# Patient Record
Sex: Male | Born: 1940 | Race: White | Hispanic: No | Smoking: Former smoker
Health system: Southern US, Community
[De-identification: ages and names within clinical notes are randomized; demographics above are authoritative.]

## PROBLEM LIST (undated history)

## (undated) DIAGNOSIS — C801 Malignant (primary) neoplasm, unspecified: Secondary | ICD-10-CM

## (undated) DIAGNOSIS — J189 Pneumonia, unspecified organism: Secondary | ICD-10-CM

## (undated) DIAGNOSIS — J449 Chronic obstructive pulmonary disease, unspecified: Secondary | ICD-10-CM

## (undated) DIAGNOSIS — N400 Enlarged prostate without lower urinary tract symptoms: Secondary | ICD-10-CM

## (undated) HISTORY — DX: Pneumonia, unspecified organism: J18.9

## (undated) HISTORY — DX: Benign prostatic hyperplasia without lower urinary tract symptoms: N40.0

## (undated) HISTORY — PX: APPENDECTOMY: SHX54

## (undated) HISTORY — DX: Malignant (primary) neoplasm, unspecified: C80.1

## (undated) HISTORY — PX: LOBECTOMY: SHX5089

## (undated) HISTORY — PX: OTHER SURGICAL HISTORY: SHX169

## (undated) HISTORY — DX: Chronic obstructive pulmonary disease, unspecified: J44.9

## (undated) HISTORY — PX: LAPAROSCOPIC CHOLECYSTECTOMY: SUR755

## (undated) HISTORY — PX: TONSILLECTOMY: SHX5217

---

## 1998-11-12 ENCOUNTER — Emergency Department (HOSPITAL_COMMUNITY): Admission: EM | Admit: 1998-11-12 | Discharge: 1998-11-12 | Payer: Self-pay | Admitting: Emergency Medicine

## 1998-11-12 ENCOUNTER — Encounter: Payer: Self-pay | Admitting: Emergency Medicine

## 2003-08-31 ENCOUNTER — Encounter: Admission: RE | Admit: 2003-08-31 | Discharge: 2003-08-31 | Payer: Self-pay | Admitting: Thoracic Surgery

## 2003-09-10 ENCOUNTER — Encounter (INDEPENDENT_AMBULATORY_CARE_PROVIDER_SITE_OTHER): Payer: Self-pay | Admitting: Specialist

## 2003-09-10 ENCOUNTER — Inpatient Hospital Stay (HOSPITAL_COMMUNITY): Admission: RE | Admit: 2003-09-10 | Discharge: 2003-09-17 | Payer: Self-pay | Admitting: Thoracic Surgery

## 2003-09-24 ENCOUNTER — Encounter: Admission: RE | Admit: 2003-09-24 | Discharge: 2003-09-24 | Payer: Self-pay | Admitting: Thoracic Surgery

## 2003-10-15 ENCOUNTER — Encounter: Admission: RE | Admit: 2003-10-15 | Discharge: 2003-10-15 | Payer: Self-pay | Admitting: Thoracic Surgery

## 2003-10-21 ENCOUNTER — Other Ambulatory Visit: Admission: RE | Admit: 2003-10-21 | Discharge: 2003-10-21 | Payer: Self-pay

## 2003-10-31 ENCOUNTER — Emergency Department (HOSPITAL_COMMUNITY): Admission: EM | Admit: 2003-10-31 | Discharge: 2003-10-31 | Payer: Self-pay | Admitting: Emergency Medicine

## 2003-12-31 ENCOUNTER — Encounter: Admission: RE | Admit: 2003-12-31 | Discharge: 2003-12-31 | Payer: Self-pay | Admitting: Thoracic Surgery

## 2004-03-31 ENCOUNTER — Encounter: Admission: RE | Admit: 2004-03-31 | Discharge: 2004-03-31 | Payer: Self-pay | Admitting: Thoracic Surgery

## 2004-08-09 ENCOUNTER — Encounter: Admission: RE | Admit: 2004-08-09 | Discharge: 2004-08-09 | Payer: Self-pay | Admitting: Thoracic Surgery

## 2005-02-07 ENCOUNTER — Encounter: Admission: RE | Admit: 2005-02-07 | Discharge: 2005-02-07 | Payer: Self-pay | Admitting: Thoracic Surgery

## 2005-08-30 ENCOUNTER — Encounter: Admission: RE | Admit: 2005-08-30 | Discharge: 2005-08-30 | Payer: Self-pay | Admitting: Thoracic Surgery

## 2006-03-07 ENCOUNTER — Encounter: Admission: RE | Admit: 2006-03-07 | Discharge: 2006-03-07 | Payer: Self-pay | Admitting: Thoracic Surgery

## 2006-05-08 ENCOUNTER — Ambulatory Visit: Payer: Self-pay | Admitting: Internal Medicine

## 2006-07-03 ENCOUNTER — Ambulatory Visit: Payer: Self-pay | Admitting: Internal Medicine

## 2006-10-02 ENCOUNTER — Ambulatory Visit: Payer: Self-pay | Admitting: Thoracic Surgery

## 2006-10-02 ENCOUNTER — Encounter: Admission: RE | Admit: 2006-10-02 | Discharge: 2006-10-02 | Payer: Self-pay | Admitting: Thoracic Surgery

## 2007-09-12 ENCOUNTER — Ambulatory Visit: Payer: Self-pay | Admitting: Thoracic Surgery

## 2007-09-12 ENCOUNTER — Encounter: Admission: RE | Admit: 2007-09-12 | Discharge: 2007-09-12 | Payer: Self-pay | Admitting: Thoracic Surgery

## 2007-09-18 DIAGNOSIS — J449 Chronic obstructive pulmonary disease, unspecified: Secondary | ICD-10-CM | POA: Insufficient documentation

## 2007-09-18 DIAGNOSIS — J441 Chronic obstructive pulmonary disease with (acute) exacerbation: Secondary | ICD-10-CM | POA: Insufficient documentation

## 2007-09-19 ENCOUNTER — Ambulatory Visit: Payer: Self-pay | Admitting: Internal Medicine

## 2007-09-19 DIAGNOSIS — C3490 Malignant neoplasm of unspecified part of unspecified bronchus or lung: Secondary | ICD-10-CM

## 2007-09-19 LAB — CONVERTED CEMR LAB
Basophils Absolute: 0.1 10*3/uL (ref 0.0–0.1)
Eosinophils Absolute: 0.1 10*3/uL (ref 0.0–0.6)
HCT: 43.5 % (ref 39.0–52.0)
MCHC: 32.1 g/dL (ref 30.0–36.0)
MCV: 87.3 fL (ref 78.0–100.0)
Monocytes Relative: 7.4 % (ref 3.0–11.0)
Neutrophils Relative %: 59.3 % (ref 43.0–77.0)
RBC: 4.98 M/uL (ref 4.22–5.81)
RDW: 12.9 % (ref 11.5–14.6)

## 2007-10-10 ENCOUNTER — Ambulatory Visit: Payer: Self-pay | Admitting: Internal Medicine

## 2007-10-14 ENCOUNTER — Ambulatory Visit (HOSPITAL_COMMUNITY): Admission: RE | Admit: 2007-10-14 | Discharge: 2007-10-14 | Payer: Self-pay | Admitting: General Surgery

## 2007-10-14 ENCOUNTER — Encounter (INDEPENDENT_AMBULATORY_CARE_PROVIDER_SITE_OTHER): Payer: Self-pay | Admitting: General Surgery

## 2008-03-11 ENCOUNTER — Ambulatory Visit: Payer: Self-pay | Admitting: Thoracic Surgery

## 2008-03-11 ENCOUNTER — Encounter: Admission: RE | Admit: 2008-03-11 | Discharge: 2008-03-11 | Payer: Self-pay | Admitting: Thoracic Surgery

## 2008-04-10 ENCOUNTER — Ambulatory Visit: Payer: Self-pay | Admitting: Internal Medicine

## 2008-07-26 ENCOUNTER — Ambulatory Visit: Payer: Self-pay | Admitting: Diagnostic Radiology

## 2008-07-26 ENCOUNTER — Encounter: Payer: Self-pay | Admitting: Emergency Medicine

## 2008-07-27 ENCOUNTER — Inpatient Hospital Stay (HOSPITAL_COMMUNITY): Admission: EM | Admit: 2008-07-27 | Discharge: 2008-07-29 | Payer: Self-pay | Admitting: Internal Medicine

## 2008-09-02 ENCOUNTER — Ambulatory Visit: Payer: Self-pay | Admitting: Thoracic Surgery

## 2008-10-08 ENCOUNTER — Ambulatory Visit: Payer: Self-pay | Admitting: Internal Medicine

## 2008-10-12 ENCOUNTER — Telehealth: Payer: Self-pay | Admitting: Internal Medicine

## 2009-01-05 ENCOUNTER — Telehealth (INDEPENDENT_AMBULATORY_CARE_PROVIDER_SITE_OTHER): Payer: Self-pay | Admitting: *Deleted

## 2009-01-13 ENCOUNTER — Ambulatory Visit: Payer: Self-pay | Admitting: Internal Medicine

## 2009-01-19 ENCOUNTER — Ambulatory Visit: Payer: Self-pay | Admitting: Internal Medicine

## 2009-03-22 ENCOUNTER — Telehealth (INDEPENDENT_AMBULATORY_CARE_PROVIDER_SITE_OTHER): Payer: Self-pay | Admitting: *Deleted

## 2009-05-16 ENCOUNTER — Emergency Department (HOSPITAL_BASED_OUTPATIENT_CLINIC_OR_DEPARTMENT_OTHER): Admission: EM | Admit: 2009-05-16 | Discharge: 2009-05-16 | Payer: Self-pay | Admitting: Emergency Medicine

## 2009-05-20 ENCOUNTER — Ambulatory Visit: Payer: Self-pay | Admitting: Diagnostic Radiology

## 2009-05-20 ENCOUNTER — Encounter: Payer: Self-pay | Admitting: Emergency Medicine

## 2009-05-20 ENCOUNTER — Encounter (INDEPENDENT_AMBULATORY_CARE_PROVIDER_SITE_OTHER): Payer: Self-pay | Admitting: General Surgery

## 2009-05-20 ENCOUNTER — Inpatient Hospital Stay (HOSPITAL_COMMUNITY): Admission: EM | Admit: 2009-05-20 | Discharge: 2009-05-21 | Payer: Self-pay | Admitting: Emergency Medicine

## 2009-05-24 ENCOUNTER — Emergency Department (HOSPITAL_BASED_OUTPATIENT_CLINIC_OR_DEPARTMENT_OTHER): Admission: EM | Admit: 2009-05-24 | Discharge: 2009-05-25 | Payer: Self-pay | Admitting: Emergency Medicine

## 2009-06-02 ENCOUNTER — Emergency Department (HOSPITAL_BASED_OUTPATIENT_CLINIC_OR_DEPARTMENT_OTHER): Admission: EM | Admit: 2009-06-02 | Discharge: 2009-06-02 | Payer: Self-pay | Admitting: Emergency Medicine

## 2009-06-14 ENCOUNTER — Ambulatory Visit (HOSPITAL_COMMUNITY): Admission: RE | Admit: 2009-06-14 | Discharge: 2009-06-15 | Payer: Self-pay | Admitting: Urology

## 2009-06-16 ENCOUNTER — Emergency Department (HOSPITAL_BASED_OUTPATIENT_CLINIC_OR_DEPARTMENT_OTHER): Admission: EM | Admit: 2009-06-16 | Discharge: 2009-06-16 | Payer: Self-pay | Admitting: Emergency Medicine

## 2009-06-22 ENCOUNTER — Emergency Department (HOSPITAL_BASED_OUTPATIENT_CLINIC_OR_DEPARTMENT_OTHER): Admission: EM | Admit: 2009-06-22 | Discharge: 2009-06-22 | Payer: Self-pay | Admitting: Emergency Medicine

## 2009-08-20 ENCOUNTER — Ambulatory Visit: Payer: Self-pay | Admitting: Diagnostic Radiology

## 2009-08-20 ENCOUNTER — Ambulatory Visit (HOSPITAL_BASED_OUTPATIENT_CLINIC_OR_DEPARTMENT_OTHER): Admission: RE | Admit: 2009-08-20 | Discharge: 2009-08-20 | Payer: Self-pay | Admitting: Emergency Medicine

## 2010-04-12 ENCOUNTER — Encounter: Payer: Self-pay | Admitting: Internal Medicine

## 2010-04-12 ENCOUNTER — Encounter: Admission: RE | Admit: 2010-04-12 | Discharge: 2010-04-12 | Payer: Self-pay | Admitting: Thoracic Surgery

## 2010-04-12 ENCOUNTER — Ambulatory Visit: Payer: Self-pay | Admitting: Thoracic Surgery

## 2010-07-27 ENCOUNTER — Telehealth (INDEPENDENT_AMBULATORY_CARE_PROVIDER_SITE_OTHER): Payer: Self-pay | Admitting: *Deleted

## 2010-07-28 ENCOUNTER — Ambulatory Visit
Admission: RE | Admit: 2010-07-28 | Discharge: 2010-07-28 | Payer: Self-pay | Source: Home / Self Care | Attending: Internal Medicine | Admitting: Internal Medicine

## 2010-07-28 DIAGNOSIS — R609 Edema, unspecified: Secondary | ICD-10-CM

## 2010-07-28 DIAGNOSIS — N390 Urinary tract infection, site not specified: Secondary | ICD-10-CM

## 2010-08-21 ENCOUNTER — Encounter: Payer: Self-pay | Admitting: Thoracic Surgery

## 2010-08-22 ENCOUNTER — Encounter: Payer: Self-pay | Admitting: Thoracic Surgery

## 2010-09-01 NOTE — Progress Notes (Signed)
Summary: APPOINTMENT  Phone Note Call from Patient Call back at Home Phone [phone]   Caller: Patient Call For: [facility] Summary of Call: MR. Kuras PHONED HE IS HAVING TROUBLE BREATING AND WOULD LIKE TO BE SEEN. DR. Sinclair Ship FIRST AVAILABLE IS 08/19/10 AND PATIENT WOULD LIKE TO BE SEEN SOONER. PATIENT CAN REACHED AT HOME AFTER 2:30 PM [phone] AND AT WORK UNTIL 2:00 [phone] Initial call taken by: Vedia Coffer,  July 27, 2010 3:36 PM  Follow-up for Phone Call        Spoke with pt.  He is c/o recent increase in SOB and requests appt- last seen June 2010- per Katie add on at 1:30 pm and advise the pt to arrive at 1:15 pm.  This was done.   Follow-up by: Vernie Murders,  July 27, 2010 3:43 PM

## 2010-09-01 NOTE — Assessment & Plan Note (Signed)
Summary: sob/1:15 pm appt per Katie//lmr   Primary Provider/Referring Provider:  [facility]  CC:  Acute visit-SOB with activityx 6 months..  History of Present Illness: 10/08/08- COPD, hx adenoca RUL Hospitalists managed a pneumonia at [place]. Feels not breathing as well since then- more easily winded. Saw Dr Edwyna Shell in f/u few weeks. Feet swell ever since his cancer surgery 5 yrs ago- denies hx phlebitis. He questions our weight and BP- I got 128/58 right arm sitting now Dr Edwyna Shell did cxr-"good". Denies routine cough or phlegm. Has had pneumovax. Last PFT severe copd 2007. Still goes dancing- dances every third song instead of every second now. Out of work past year - economy.  01/19/09- COPD, hx adenoca RUL Notices more easily dyspneic mowing lawn, dancing. Air flows ok, maybe a little wheeze, but no cough, phlegm, chest pain. Feet swell some- heart tests were negative so he occasionally uses a diuretic Feet do occ swell..No relation to weather or anything ecept level of exertion. He dieted weight back down because he breathes better that way. Expresses frustration that he is limited by his breathing- wants to be active. PFT- severe obstructive disease with some response to bronchilator. Discussed trial of theophylline - has had hx of heartburn  July 28, 2010-  COPD, hx adenoca RUL Nurse-CC: Acute visit-SOB with activityx 6 months. Recently got over a bronchitis Rxd by his PCP w/ 2 courses of prednisone. He was given Dulera 100, then Asmanex 220, but once feeling beter he has stopped both. Not using theophylline pills"forgot". Has felt more short of breath with exertion and with ADLs and more labored over past 6 months.  Cough is only occasionally productive. Denies chest pain, blood. Today he reached and had sudden sharp right back pain. Easing after ibuprofen. Had UTI (Hx TURP), still uncomfortable after cranberry juice.     Preventive Screening-Counseling &  Management  Alcohol-Tobacco     Smoking Status: quit     Year Quit: 2006     Pack years: 46 years  2 packs daily  Current Medications (verified): 1)  Spiriva Handihaler 18 Mcg  Caps (Tiotropium Bromide Monohydrate) .... Inhale 1 Puff Once Daily As Needed 2)  Proair Hfa 108 (90 Base) Mcg/act  Aers (Albuterol Sulfate) .... Inhale 2 Puffs Every 4 Hours As Needed 3)  Flomax 0.4 Mg Caps (Tamsulosin Hcl) .... Take 1 By Mouth Two Times A Day 4)  Vitamin C .... Take 1 Tablet By Mouth Once A Day 5)  Glucosamine Chondrotin .... Take 1 Tablet By Mouth Once A Day 6)  Theophylline Cr 100 Mg Xr12h-Tab (Theophylline) .Marland Kitchen.. 1 Twice Daily After Food  Allergies (verified): 1)  ! Codeine 2)  ! * Antihistamines 3)  ! Morphine 4)  ! * Tape  Past History:  Past Medical History: Last updated: 09/26/07 C O P D enlarged prostate Adenocarcinoma RUL Pneumonias as a child and in 2004  Past Surgical History: Last updated: 09-26-07 RUL lobectomy 2005 no adjuvant cyst on wrist Tonsillectomy  Family History: Last updated: September 26, 2007 Father died- suicide Son with asthma  Social History: Last updated: 07/28/2010 Patient states former smoker.  divorced Production manager for an Neurosurgeon in [employer]  Risk Factors: Smoking Status: quit (07/28/2010)  Social History: Patient states former smoker.  divorced Production manager for an Neurosurgeon in [employer]  Review of Systems      See HPI       The patient complains of shortness of  breath with activity and non-productive cough.  The patient denies shortness of breath at rest, productive cough, coughing up blood, irregular heartbeats, acid heartburn, indigestion, loss of appetite, weight change, abdominal pain, difficulty swallowing, sore throat, tooth/dental problems, headaches, nasal congestion/difficulty breathing through nose, and sneezing.         mscwp acute  Vital Signs:  Patient profile:   70 year  old male Height:      67 inches Weight:      167.13 pounds BMI:     26.27 O2 Sat:      97 % on Room air Pulse rate:   77 / minute BP sitting:   110 / 78  (left arm) Cuff size:   regular  Vitals Entered By: Reynaldo Minium CMA (July 28, 2010 1:28 PM)  O2 Flow:  Room air CC: Acute visit-SOB with activityx 6 months.   Physical Exam  Additional Exam:  General: A/Ox3; pleasant and cooperative, NAD ,trim SKIN: no rash, lesions NODES: no lymphadenopathy HEENT: [place]/AT, EOM- WNL, Conjuctivae- clear, PERRLA, TM-WNL, Nose- clear, Throat- clear and wnl, Mallampati  II NECK: Supple w/ fair ROM, JVD- none, normal carotid impulses w/o bruits Thyroid- normal to palpation CHEST: Clear to P&A, diminished, unlabored at rest. 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  Try adding an as needed diuretic.   Problem # 2:  URINARY TRACT INFECTION (ICD-599.0)  Hx prostatism/ TURP. Recent dysuria. Will treat until he can get back to [place].   His updated medication list for this problem includes:    Cipro 250 Mg Tabs (Ciprofloxacin hcl) .Marland Kitchen... 1 two times a day x 3 days  Problem # 3:  PERIPHERAL EDEMA (ICD-782.3) Probably Cor pulmonale, but if persists, he will need to agree to a cxr. I don't see evidence of bilateral DVT at this time. Will give as needed lasix.  Medications Added to Medication List This Visit: 1)  Flomax 0.4 Mg Caps (Tamsulosin hcl) .... Take 1 by mouth two times a day 2)  Cipro 250 Mg Tabs (Ciprofloxacin hcl) .Marland Kitchen.. 1 two times a day x 3 days 3)  Furosemide 20 Mg Tabs (Furosemide) .Marland Kitchen.. 1 daily if needed for fluid  Other Orders: Est. Patient Level IV (45409)  Patient Instructions: 1)  Please schedule a  follow-up appointment in 2 months. 2)  Try furosemide/ lasix 20 mg, one daily as needed for fluid retention 3)  script to drug store 4)  script for cipro for UTI to drug store 5)  Resume theophylline 6)  continue Spiriva and use Proair rescue inhaler as needed Prescriptions: FUROSEMIDE 20 MG TABS (FUROSEMIDE) 1 daily if needed for fluid  #15 x 0   Entered and Authorized by:   Waymon Budge MD   Signed by:   Waymon Budge MD on 07/28/2010   Method used:   Electronically to        [facility] [phone]* (retail)       [address]       [place], [place]  [address]       Ph: [phone] or [phone]       Fax: [phone]   RxID:   [id number] CIPRO 250 MG TABS (CIPROFLOXACIN HCL)  1 two times a day x 3 days  #6 x 0   Entered and Authorized by:   Waymon Budge MD   Signed by:   Waymon Budge MD on 07/28/2010   Method used:   Electronically to        [facility] [phone]* (retail)       [address]       [place], [place]  [address]       Ph: [phone] or [phone]       Fax: [phone]   RxID:   [id number]

## 2010-09-26 ENCOUNTER — Other Ambulatory Visit: Payer: Medicare Other

## 2010-09-26 ENCOUNTER — Other Ambulatory Visit: Payer: Self-pay | Admitting: Internal Medicine

## 2010-09-26 ENCOUNTER — Encounter: Payer: Self-pay | Admitting: Internal Medicine

## 2010-09-26 ENCOUNTER — Encounter (INDEPENDENT_AMBULATORY_CARE_PROVIDER_SITE_OTHER): Payer: Self-pay | Admitting: *Deleted

## 2010-09-26 ENCOUNTER — Ambulatory Visit (INDEPENDENT_AMBULATORY_CARE_PROVIDER_SITE_OTHER)
Admission: RE | Admit: 2010-09-26 | Discharge: 2010-09-26 | Disposition: A | Discharge status: Home / Self Care | Payer: Medicare Other | Source: Ambulatory Visit | Attending: Internal Medicine | Admitting: Internal Medicine

## 2010-09-26 ENCOUNTER — Ambulatory Visit (INDEPENDENT_AMBULATORY_CARE_PROVIDER_SITE_OTHER): Payer: Medicare Other | Admitting: Internal Medicine

## 2010-09-26 DIAGNOSIS — J449 Chronic obstructive pulmonary disease, unspecified: Secondary | ICD-10-CM

## 2010-09-26 DIAGNOSIS — R609 Edema, unspecified: Secondary | ICD-10-CM

## 2010-09-26 DIAGNOSIS — J4489 Other specified chronic obstructive pulmonary disease: Secondary | ICD-10-CM

## 2010-09-26 DIAGNOSIS — C3410 Malignant neoplasm of upper lobe, unspecified bronchus or lung: Secondary | ICD-10-CM

## 2010-09-26 DIAGNOSIS — R0602 Shortness of breath: Secondary | ICD-10-CM

## 2010-09-26 DIAGNOSIS — J984 Other disorders of lung: Secondary | ICD-10-CM | POA: Insufficient documentation

## 2010-09-26 LAB — CBC WITH DIFFERENTIAL/PLATELET
Basophils Absolute: 0 10*3/uL (ref 0.0–0.1)
Eosinophils Relative: 1.7 % (ref 0.0–5.0)
HCT: 41.8 % (ref 39.0–52.0)
Hemoglobin: 14.1 g/dL (ref 13.0–17.0)
Lymphocytes Relative: 31.7 % (ref 12.0–46.0)
Monocytes Relative: 8.4 % (ref 3.0–12.0)
Neutro Abs: 2.8 10*3/uL (ref 1.4–7.7)
Platelets: 187 10*3/uL (ref 150.0–400.0)
RDW: 13.6 % (ref 11.5–14.6)
WBC: 4.9 10*3/uL (ref 4.5–10.5)

## 2010-09-26 LAB — BASIC METABOLIC PANEL
CO2: 29 mEq/L (ref 19–32)
Chloride: 103 mEq/L (ref 96–112)
Glucose, Bld: 97 mg/dL (ref 70–99)
Sodium: 140 mEq/L (ref 135–145)

## 2010-09-27 ENCOUNTER — Encounter: Payer: Self-pay | Admitting: Internal Medicine

## 2010-09-27 DIAGNOSIS — R0602 Shortness of breath: Secondary | ICD-10-CM | POA: Insufficient documentation

## 2010-09-28 ENCOUNTER — Encounter (INDEPENDENT_AMBULATORY_CARE_PROVIDER_SITE_OTHER): Payer: Medicare Other

## 2010-09-28 ENCOUNTER — Ambulatory Visit (INDEPENDENT_AMBULATORY_CARE_PROVIDER_SITE_OTHER)
Admission: RE | Admit: 2010-09-28 | Discharge: 2010-09-28 | Disposition: A | Discharge status: Home / Self Care | Payer: Medicare Other | Source: Ambulatory Visit | Attending: Internal Medicine | Admitting: Internal Medicine

## 2010-09-28 ENCOUNTER — Other Ambulatory Visit: Payer: Self-pay | Admitting: Internal Medicine

## 2010-09-28 ENCOUNTER — Encounter: Payer: Self-pay | Admitting: Internal Medicine

## 2010-09-28 DIAGNOSIS — R609 Edema, unspecified: Secondary | ICD-10-CM

## 2010-09-28 DIAGNOSIS — R911 Solitary pulmonary nodule: Secondary | ICD-10-CM

## 2010-09-28 DIAGNOSIS — J984 Other disorders of lung: Secondary | ICD-10-CM

## 2010-09-28 HISTORY — DX: Malignant (primary) neoplasm, unspecified: C80.1

## 2010-09-28 MED ORDER — IOHEXOL 300 MG/ML  SOLN
80.0000 mL | Freq: Once | INTRAMUSCULAR | Status: AC | PRN
  Administered 2010-09-28: 80 mL via INTRAVENOUS

## 2010-09-29 ENCOUNTER — Telehealth: Payer: Self-pay | Admitting: Internal Medicine

## 2010-09-30 ENCOUNTER — Telehealth: Payer: Self-pay | Admitting: Internal Medicine

## 2010-10-06 NOTE — Miscellaneous (Signed)
Summary: Orders Update-CT Chest(PE protocol) Venous Doppler orders//kcw  Clinical Lists Changes  Problems: Added new problem of DYSPNEA (ICD-786.05) Orders: Added new Referral order of Doppler Referral (Doppler) - Signed Added new Referral order of Radiology Referral (Radiology) - Signed

## 2010-10-06 NOTE — Progress Notes (Signed)
Summary: Prednisone taper for exac COPD  Phone Note Outgoing Call   Summary of Call: After phone messages today I called him at home. he is clearly somewhat labored. ER if necessary. With negative CTa for pneumonia or PE, I will manage as exacerb of COPD and call in pred taper as discussed.     New/Updated Medications: PREDNISONE 10 MG TABS (PREDNISONE) 1 tab four times daily x 2 days, 3 times daily x 2 days, 2 times daily x 2 days, 1 time daily x 2 days Prescriptions: PREDNISONE 10 MG TABS (PREDNISONE) 1 tab four times daily x 2 days, 3 times daily x 2 days, 2 times daily x 2 days, 1 time daily x 2 days  #20 x 0   Entered and Authorized by:   Waymon Budge MD   Signed by:   Waymon Budge MD on 09/30/2010   Method used:   Electronically to        [facility] [phone]* (retail)       [address]       [place], [place]  [address]       Ph: [phone] or [phone]       Fax: [phone]   RxID:   [id number]

## 2010-10-06 NOTE — Miscellaneous (Signed)
Summary: Orders Update  Clinical Lists Changes  Orders: Added new Test order of Venous Duplex Lower Extremity (Venous Duplex Lower) - Signed 

## 2010-10-06 NOTE — Assessment & Plan Note (Signed)
Summary: rov 2 months//kp   Primary Provider/Referring Provider:  [facility]  CC:  2 month followup. Pt states breathing is getting worse since last seen.  Gets SOB with little exertion such as putting on deoderant.  Has also noticed some wheezing since this am..  History of Present Illness: 01/19/09- COPD, hx adenoca RUL Notices more easily dyspneic mowing lawn, dancing. Air flows ok, maybe a little wheeze, but no cough, phlegm, chest pain. Feet swell some- heart tests were negative so he occasionally uses a diuretic Feet do occ swell..No relation to weather or anything ecept level of exertion. He dieted weight back down because he breathes better that way. Expresses frustration that he is limited by his breathing- wants to be active. PFT- severe obstructive disease with some response to bronchilator. Discussed trial of theophylline - has had hx of heartburn  July 28, 2010-  COPD, hx adenoca RUL Nurse-CC: Acute visit-SOB with activityx 6 months. Recently got over a bronchitis Rxd by his PCP w/ 2 courses of prednisone. He was given Dulera 100, then Asmanex 220, but once feeling beter he has stopped both. Not using theophylline pills"forgot". Has felt more short of breath with exertion and with ADLs and more labored over past 6 months.  Cough is only occasionally productive. Denies chest pain, blood. Today he reached and had sudden sharp right back pain. Easing after ibuprofen. Had UTI (Hx TURP), still uncomfortable after cranberry juice.   September 26, 2010- COPD, hx adenoca RUL Nurse-CC: 2 month followup. Pt states breathing is getting worse since last seen.  Gets SOB with little exertion such as putting on deodorant.  Has also noticed some wheezing since this am. Since last here he is noting much easier DOE getting dressed. Gradually worse since December. Occasional dry cough. Woke with some wheeze this AM. Denies chest pain, palpitation, swelling. Some nose bleed- none other. No bad  colds since Decmber.      Preventive Screening-Counseling & Management  Alcohol-Tobacco     Smoking Status: quit     Year Quit: 2006     Pack years: 46 years  2 packs daily  Current Medications (verified): 1)  Spiriva Handihaler 18 Mcg  Caps (Tiotropium Bromide Monohydrate) .... Inhale 1 Puff Once Daily As Needed 2)  Proair Hfa 108 (90 Base) Mcg/act  Aers (Albuterol Sulfate) .... Inhale 2 Puffs Every 4 Hours As Needed 3)  Flomax 0.4 Mg Caps (Tamsulosin Hcl) .... Take 1 By Mouth Two Times A Day 4)  Vitamin C .... Take 1 Tablet By Mouth Once A Day 5)  Glucosamine Chondrotin .... Take 1 Tablet By Mouth Once A Day 6)  Theophylline Cr 100 Mg Xr12h-Tab (Theophylline) .Marland Kitchen.. 1 Twice Daily After Food 7)  Furosemide 20 Mg Tabs (Furosemide) .Marland Kitchen.. 1 Daily If Needed For Fluid  Allergies (verified): 1)  ! Codeine 2)  ! * Antihistamines 3)  ! Morphine 4)  ! * Tape  Past History:  Past Medical History: Last updated: 09/27/07 C O P D enlarged prostate Adenocarcinoma RUL Pneumonias as a child and in 2004  Past Surgical History: Last updated: 09-27-07 RUL lobectomy 2005 no adjuvant cyst on wrist Tonsillectomy  Family History: Last updated: 27-Sep-2007 Father died- suicide Son with asthma  Social History: Last updated: 07/28/2010 Patient states former smoker.  divorced Production manager for an Neurosurgeon in [employer]  Risk Factors: Smoking Status: quit (09/26/2010)  Review of Systems       The patient complains of  shortness of breath with activity and non-productive cough.  The patient denies shortness of breath at rest, coughing up blood, chest pain, irregular heartbeats, acid heartburn, indigestion, loss of appetite, weight change, abdominal pain, difficulty swallowing, sore throat, tooth/dental problems, headaches, nasal congestion/difficulty breathing through nose, and sneezing.    Vital Signs:  Patient profile:   70 year old male Weight:      162.25  pounds O2 Sat:      96 % on Room air Pulse rate:   79 / minute BP sitting:   122 / 64  (left arm)  Vitals Entered By: Vernie Murders (September 26, 2010 2:53 PM)  O2 Flow:  Room air  Physical Exam  Additional Exam:  General: A/Ox3; pleasant and cooperative, NAD ,trim, resting Room air sat 96%, pulse 79.  SKIN: no rash, lesions NODES: no lymphadenopathy HEENT: [place]/AT, EOM- WNL, Conjuctivae- clear, PERRLA, TM-WNL, Nose- clear, Throat- clear and wnl, Mallampati  II NECK: Supple w/ fair ROM, JVD- ? 1 cm, normal carotid impulses w/o bruits Thyroid- normal to palpation CHEST: Clear to P&A, diminished, unlabored at rest, loose cough.  HEART: RRR, no m/g/r heard ABDOMEN: nontender WUJ:WJXB, nl pulses, 1 edema  lower legs, nontender, Neg Homan's NEURO: Grossly intact to observation      Impression & Recommendations:  Problem # 1:  C O P D (ICD-496) Exacerbation of COPD with bronchitis. DDX = heart failure, anemia, VTE. I will get CXR , CBC and D-dimer. Give neb albuterol, depo 80, Z pak.   Medications Added to Medication List This Visit: 1)  Zithromax Z-pak 250 Mg Tabs (Azithromycin) .... 2 today then one daily  Other Orders: Est. Patient Level III ([address]) T-2 View CXR (71020TC) TLB-CBC Platelet - w/Differential (85025-CBCD) TLB-BMP (Basic Metabolic Panel-BMET) (80048-METABOL) TLB-BNP (B-Natriuretic Peptide) (83880-BNPR) T-D-Dimer Fibrin Derivatives Quantitive [phone]) Prescription Created Electronically [phone]) Admin of Therapeutic Inj  intramuscular or subcutaneous (62952) Depo- Medrol 80mg  (J1040) Nebulizer Tx (84132) Albuterol Sulfate Sol 1mg  unit dose (G4010)  Patient Instructions: 1)  Please schedule a follow-up appointment in 1 month. 2)  A chest x-ray has been recommended.  Your imaging study may require preauthorization.  3)  Lab 4)  neb a 5)  script sent for Z pak 6)  depo 80 Prescriptions: ZITHROMAX Z-PAK 250 MG TABS (AZITHROMYCIN) 2 today then one daily  #1  pak x 0   Entered and Authorized by:   Waymon Budge MD   Signed by:   Waymon Budge MD on 09/26/2010   Method used:   Electronically to        [facility] [phone][facility] (retail)       [address]       [place], [place]  [address]       Ph: [phone] or [phone]       Fax: [phone]   RxID:   [id number]    Immunization History:  Influenza Immunization History:    Influenza:  historical (06/30/2010)    Medication Administration  Injection # 1:    Medication: Depo- Medrol 80mg     Diagnosis: C O P D (ICD-496)    Route: SQ    Site: LUOQ gluteus    Exp Date: 01/2013    Lot #: [id number]    Mfr: Pharmacia    Patient tolerated injection without complications    Given by: Reynaldo Minium CMA (September 26, 2010 4:52 PM)  Medication # 1:    Medication: Albuterol Sulfate Sol 1mg  unit dose  Diagnosis: C O P D (ICD-496)    Dose: 1 vial     Route: inhaled    Exp Date: 09-2011    Lot #: [id number]    Mfr: nephron    Patient tolerated medication without complications    Given by: Reynaldo Minium CMA (September 26, 2010 4:53 PM)  Orders Added: 1)  Est. Patient Level III [13086] 2)  T-2 View CXR [71020TC] 3)  TLB-CBC Platelet - w/Differential [85025-CBCD] 4)  TLB-BMP (Basic Metabolic Panel-BMET) [80048-METABOL] 5)  TLB-BNP (B-Natriuretic Peptide) [83880-BNPR] 6)  T-D-Dimer Fibrin Derivatives Quantitive [[id number]] 7)  Prescription Created Electronically [G8553] 8)  Admin of Therapeutic Inj  intramuscular or subcutaneous [96372] 9)  Depo- Medrol 80mg  [J1040] 10)  Nebulizer Tx [94640] 11)  Albuterol Sulfate Sol 1mg  unit dose [M8413]

## 2010-10-06 NOTE — Progress Notes (Signed)
Summary: results- nodule on cxr was old scarring per CT  Phone Note Call from Patient   Caller: Patient Call For: [name] Summary of Call: wants CT results. call [phone] (until 2pm) afterwards call home # above Initial call taken by: Tivis Ringer, CNA,  September 29, 2010 11:03 AM  Follow-up for Phone Call        Pls advise CT Chest results, thanks Vernie Murders  September 29, 2010 12:24 PM  Sent to [employer].   Waymon Budge MD,  September 29, 2010 12:30 PM  [facility] at home number as it is after 2 pm.Katie [facility] CMA  September 29, 2010 2:03 PM   Spoke with pt and notified of ct chest results per [facility] append.   He does not understand "no worrisome nodule", states that he was told after cxr, that he had a nodule.  Wants to know what further work up is needed on this.  He states that he also needs to know more about the CAD and "where is it located".  States that he has no regular PCP besides a Dr Derrek Gu at [facility] who he rarely sees.  Has no cardiologist.  He also states that he read where he should not take spiriva with having enlarged prostate.  He states has had many issues with prostate, including multiple surgeries and wants to know if should d/c spiriva.  Pls advise thanks Follow-up by: Vernie Murders,  September 30, 2010 9:29 AM  Additional Follow-up for Phone Call Additional follow up Details #1::         1) Please contact the radiologis who read his CT- Ask if CT indicates there is No nodule. 2)suggest Cardiology referral for CAD on CT -Pt is anxious. 3)D/C Spiriva if trouble emptying bladder otherwise ok to use.  I called Kevein Dover,MD that read CT Angio and he states that this looks more like scarring from prior surgery not a nodule-Dr Dover compared with CXR and prior CT in 2009.  I calle pt to inform him of the above recs and had to leave a message at home number for patient to call back and ask for me.  Additional Follow-up by: Philipp Deputy CMA,  September 30, 2010 4:53 PM    Additional  Follow-up for Phone Call Additional follow up Details #2::    spoke with pt and made him aware of dr Roxy Cedar responses to his questions, pt is fine with the ct results and with remaining on the spiriva. pt does not feel he needs a cardiologist ref. at this time. he is c/o increased sob with any activity at all, pt states when he saw dr Gethsemane Fischler for ov he was given a shot and a breathing treatment and after this he felt good for about 2 days but now he feels exactly like he did when he came in for appt pls advise if further meds are needed.   Philipp Deputy [facility]  September 30, 2010 4:57 PM

## 2010-10-28 ENCOUNTER — Encounter: Payer: Self-pay | Admitting: Internal Medicine

## 2010-11-01 ENCOUNTER — Encounter: Payer: Self-pay | Admitting: Internal Medicine

## 2010-11-01 ENCOUNTER — Ambulatory Visit (INDEPENDENT_AMBULATORY_CARE_PROVIDER_SITE_OTHER): Payer: Medicare Other | Admitting: Internal Medicine

## 2010-11-01 VITALS — BP 118/62 | HR 81 | Ht 66.0 in | Wt 160.2 lb

## 2010-11-01 DIAGNOSIS — J449 Chronic obstructive pulmonary disease, unspecified: Secondary | ICD-10-CM

## 2010-11-01 DIAGNOSIS — C3410 Malignant neoplasm of upper lobe, unspecified bronchus or lung: Secondary | ICD-10-CM

## 2010-11-01 NOTE — Progress Notes (Signed)
  Subjective:    Patient ID: [name], male    DOB: 02-24-1941, 70 y.o.   MRN: [id number]  HPI 79 yoM former smoker followed for COPD w. Hx adenoca RUL, dyspnea. Last here Sep 26, 2010 with main c/o still being DOE. "Not good" but no change. CTa  09/28/10 was neg for PE and showed scarring but no nodule.Marland Kitchen  PFT June 2010 reviewed FEV1/FVC 0.38 = very severe COPD as likliest reason for his dyspnea. CBC 09/26/10- Hgb 14.1, BNP 19.3. D-dimer 0.64 (but neg leg dopplers and CT). Noting some increased phlegm 3-4 days. Some chest congestion on morning waking, clear mucus. Denies effect of pollen. Comfortable at rest, winded climbing slope in our parking lot. He didn't do Pulmonary Rehab because of cost.  Review of Systems See HPI Constitutional:   No weight loss, night sweats,  Fevers, chills, fatigue, lassitude. HEENT:   No headaches,  Difficulty swallowing,  Tooth/dental problems,  Sore throat,                No sneezing, itching, ear ache, nasal congestion, post nasal drip,   CV:  No chest pain,  Orthopnea, PND, swelling in lower extremities, anasarca, dizziness, palpitations  GI  No heartburn, indigestion, abdominal pain, nausea, vomiting, diarrhea, change in bowel habits, loss of appetite  Resp:.  No excess mucus, mild productive cough,  ,  No coughing up of blood.  No change in color of mucus.  No wheezing.  No chest wall deformity  Skin: no rash or lesions.  GU: no dysuria, change in color of urine, no urgency or frequency.  No flank pain.  MS:  No joint pain or swelling.  No decreased range of motion.  No back pain.  Psych:  No change in mood or affect. No depression or anxiety.  No memory loss.     Objective:   Physical Exam General- Alert, Oriented, Affect-appropriate, Distress- none acute    Skin- rash-none, lesions- none, excoriation- none  Lymphadenopathy- none  Head- atraumatic  Eyes- Gross vision intact, PERRLA, conjunctivae clear, secretions  Ears- Hearing normal,  canals, Tm L ,   R ,  Nose- Clear, No-Septal dev, mucus, polyps, erosion, perforation   Throat- Mallampati II , mucosa clear , drainage- none, tonsils- atrophic  Neck- flexible , trachea midline, no stridor , thyroid nl, carotid no bruit  Chest - symmetrical excursion , unlabored     Heart/CV- RRR , no murmur , no gallop  , no rub, nl s1 s2                     - JVD- none , edema- none, stasis changes- none, varices- none     Lung- Very distant, wheeze- none, cough- none , dullness-none, rub- none     Chest wall-Abd- tender-no, distended-no, bowel sounds-present, HSM- no  Br/ Gen/ Rectal- Not done, not indicated  Extrem- cyanosis- none, clubbing, none, atrophy- none, strength- nl  Neuro- grossly intact to observation        Assessment & Plan:

## 2010-11-01 NOTE — Patient Instructions (Signed)
Consider "Silver Slippers" exercise program at "[facility]"  Order- schedule PFT and 6 MWT      Sample Advair 250/ 50- 1 puff and rinse mouth well, twice daily.

## 2010-11-02 LAB — URINALYSIS, ROUTINE W REFLEX MICROSCOPIC
Bilirubin Urine: NEGATIVE
Glucose, UA: NEGATIVE mg/dL
Specific Gravity, Urine: 1.025 (ref 1.005–1.030)
Urobilinogen, UA: 0.2 mg/dL (ref 0.0–1.0)
pH: 6.5 (ref 5.0–8.0)

## 2010-11-02 LAB — URINE MICROSCOPIC-ADD ON

## 2010-11-02 LAB — URINE CULTURE
Colony Count: 90000
Colony Count: NO GROWTH
Culture: NO GROWTH
Culture: NO GROWTH
Special Requests: NEGATIVE

## 2010-11-03 ENCOUNTER — Encounter: Payer: Self-pay | Admitting: Internal Medicine

## 2010-11-03 LAB — URINALYSIS, ROUTINE W REFLEX MICROSCOPIC
Bilirubin Urine: NEGATIVE
Bilirubin Urine: NEGATIVE
Glucose, UA: NEGATIVE mg/dL
Ketones, ur: 15 mg/dL — AB
Ketones, ur: NEGATIVE mg/dL
Leukocytes, UA: NEGATIVE
Leukocytes, UA: NEGATIVE
Leukocytes, UA: NEGATIVE
Nitrite: NEGATIVE
Nitrite: NEGATIVE
Nitrite: NEGATIVE
Protein, ur: NEGATIVE mg/dL
Protein, ur: NEGATIVE mg/dL
Protein, ur: NEGATIVE mg/dL
Specific Gravity, Urine: 1.016 (ref 1.005–1.030)
Urobilinogen, UA: 0.2 mg/dL (ref 0.0–1.0)
Urobilinogen, UA: 0.2 mg/dL (ref 0.0–1.0)
pH: 5.5 (ref 5.0–8.0)
pH: 6 (ref 5.0–8.0)

## 2010-11-03 LAB — DIFFERENTIAL
Basophils Absolute: 0.3 10*3/uL — ABNORMAL HIGH (ref 0.0–0.1)
Basophils Relative: 3 % — ABNORMAL HIGH (ref 0–1)
Eosinophils Absolute: 0 10*3/uL (ref 0.0–0.7)
Neutro Abs: 7.9 10*3/uL — ABNORMAL HIGH (ref 1.7–7.7)
Neutrophils Relative %: 80 % — ABNORMAL HIGH (ref 43–77)

## 2010-11-03 LAB — URINE MICROSCOPIC-ADD ON

## 2010-11-03 LAB — CBC
MCHC: 33 g/dL (ref 30.0–36.0)
MCV: 87.2 fL (ref 78.0–100.0)
Platelets: 185 10*3/uL (ref 150–400)

## 2010-11-03 LAB — BASIC METABOLIC PANEL
BUN: 22 mg/dL (ref 6–23)
CO2: 25 mEq/L (ref 19–32)
Calcium: 8.7 mg/dL (ref 8.4–10.5)
Chloride: 103 mEq/L (ref 96–112)
Creatinine, Ser: 1.7 mg/dL — ABNORMAL HIGH (ref 0.4–1.5)

## 2010-11-03 NOTE — Assessment & Plan Note (Addendum)
Severe COPD with limited response to bronchodilator. He didn't want to pay for pulmonary rehab, but is encouraged to remain active.

## 2010-11-17 ENCOUNTER — Telehealth: Payer: Self-pay | Admitting: Internal Medicine

## 2010-11-17 MED ORDER — FLUTICASONE-SALMETEROL 250-50 MCG/DOSE IN AEPB: INHALATION_SPRAY | RESPIRATORY_TRACT | Status: DC

## 2010-11-17 NOTE — Telephone Encounter (Signed)
Printed out rx and cdy signed. Gave to pt

## 2010-12-13 NOTE — H&P (Signed)
NAME:  [name], [name]                ACCOUNT NO.:  [id number]   MEDICAL RECORD NO.:  [id number]          PATIENT TYPE:  INP   LOCATION:  1426                         FACILITY:  [facility]   PHYSICIAN:  Della Goo, M.D. DATE OF BIRTH:  1940-10-06   DATE OF ADMISSION:  07/27/2008  DATE OF DISCHARGE:                              HISTORY & PHYSICAL   PRIMARY CARE PHYSICIAN:  Unassigned.   CHIEF COMPLAINT:  Shortness of breath.   HISTORY OF PRESENT ILLNESS:  This is a 70 year old male who presented to  the [facility] emergency department secondary to complaints  of worsening shortness of breath along with cough with scant mucus  production which has been occurring and worsening over the past 24  hours.  He reports that he did have a sinus infection over the past few  days, and this has begun to progress down into his chest.  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  The patient reports having fevers  and chills off and on.   The patient was evaluated in the emergency department and was found to  have chronic lung changes with increased densities in the left lung  base.  The patient does have a history of lung cancer which was  diagnosed in 2004, and he reports with surgical treatment by Dr. Edwyna Shell  he has been cancer free for the past 5 years.   PAST MEDICAL HISTORY:  Significant for:  1. COPD/emphysema.  2. Lung cancer.  3. Pleurisy.  4. Benign prostatic hypertrophy.   His medications at this time include:  1. Flomax 0.4 mg 1 ne p.o. daily.  2. Albuterol inhaler.  3. Spiriva HandiHaler.   HE HAS ALLERGIES TO ANTIHISTAMINES, CODEINE AND MORPHINE.   SOCIAL HISTORY:  The patient has a remote history of smoking.  He is a  nondrinker.   FAMILY HISTORY:  Is noncontributory.   REVIEW OF SYSTEMS:  Pertinents are mentioned above.  The patient denies  having any nausea, vomiting but he does report having decreased p.o.  intake.  Denies having any weakness, syncope,  chest pain.  He denies  having any changes in his bowel or bladder habits.   PHYSICAL EXAMINATION FINDINGS:  This is a pleasant, 70 year old, well-  nourished, well-developed male in no discomfort or acute distress.  His  vital signs are:  Temperature initially 97.3, blood pressure 146/84,  heart rate 110, respirations 28, O2 saturations 91%, and with  supplemental oxygen, the O2 saturation improved to 100%.  HEENT EXAMINATION:  Normocephalic, atraumatic.  There is no scleral  icterus or scleral injection.  Pupils are equally round, reactive to  light.  Extraocular movements are intact.  Funduscopic benign.  Nares  are patent.  Oropharynx is clear.  No exudates, erythema or tonsillar  adenopathy.  CARDIOVASCULAR:  Tachycardiac rate and rhythm.  No murmurs, gallops or  rubs appreciated.  LUNGS:  With decreased breath sounds which are rhonchorous throughout.  No rales.  No expiratory wheezes.  ABDOMEN:  Positive bowel sounds, soft, nontender, nondistended.  No  hepatosplenomegaly.  EXTREMITIES:  Without cyanosis, clubbing or edema.  NEUROLOGIC EXAMINATION:  Grossly nonfocal.   LABORATORY STUDIES:  White blood cell count 12.4, hemoglobin 14.3,  hematocrit 42.2, MCV 86.1, platelets 198, neutrophils 83%, lymphocytes  9%.  Sodium 139, potassium 4.7, chloride 101, carbon dioxide 27, BUN 14,  creatinine 1.1, and glucose 95.  Point of care cardiac markers with a  myoglobin of 77, CK-MB 1.1, troponin less than 0.05.  Chest x-ray  findings revealed above.  A CT angiogram of the chest was performed,  findings of which were negative for a pulmonary embolism.   A D-dimer study was performed, results of which were mildly elevated at  0.54, so a CT angiogram study of chest was performed, results of which  were discussed   ASSESSMENT:  A 70 year old male being admitted with:  1. Pneumonia.  2. Chronic obstructive pulmonary disease exacerbation.  3. Shortness of breath.   PLAN:  The patient will  be admitted to a telemetry area.  Cardiac  enzymes will be performed.  The patient will be placed on IV antibiotic  therapy of Rocephin and azithromycin.  An IV steroid taper has been  ordered along with nebulizer treatments with albuterol and Atrovent  therapies.  The patient's medications will be reviewed, and his inhalers  will be held for now while he is on nebulizer treatments.  The patient  will be placed on DVT and GI prophylaxis.  Further workup will ensue  pending results of the patient's clinical studies and his clinical  course.   </PAST      Della Goo, M.D.  Electronically Signed     HJ/MEDQ  D:  07/28/2008  T:  07/28/2008  Job:  [id number]

## 2010-12-13 NOTE — Assessment & Plan Note (Signed)
OFFICE VISIT   NAVARRO, NINE  DOB:  [birth year]                                        September 02, 2008  CHART #:  [id number]   The patient returns today for followup.  His CT scan done in December  showed no evidence of recurrence that was when he was admitted to the  hospital for pneumonia.  He had been out dancing and developed  pneumonia.  He is still having increasing shortness of breath.  His  lungs are clear to auscultation and percussion.  His blood pressure was  160/80, pulse 100, respirations 18, and sats were 93%.  Because of his  continued problems, I have recommended that he come back to see you for  his medical care.  He states that he does not have a routine medical  care from the standpoint of the cancer, we are now 5 years out, but he  wants [place] to continue to follow him, so we will see him again in 6 months  with a chest x-ray.   Ines Bloomer, M.D.  Electronically Signed   DPB/MEDQ  D:  09/02/2008  T:  09/02/2008  Job:  [id number]   cc:   Alfonse Alpers. Dagoberto Ligas, M.D.

## 2010-12-13 NOTE — Assessment & Plan Note (Signed)
OFFICE VISIT   DARE, SANGER  DOB:  October 16, 1940                                        April 12, 2010  CHART #:  [id number]   The patient came today.  His blood pressure was 140/85, pulse 18,  respirations 18.  His lungs were clear to auscultation and percussion.  He says he had some recent congestion.  His lungs were clear.  His chest  x-ray was stable on the left lingular area and clear.  He was supposed  to come back in 6 months but it has been over a year, so we will see him  back again in a year with another chest x-ray.   Ines Bloomer, M.D.  Electronically Signed   DPB/MEDQ  D:  04/12/2010  T:  04/13/2010  Job:  [id number]

## 2010-12-13 NOTE — Discharge Summary (Signed)
NAMEMarland [name]  [name], [name]                ACCOUNT NO.:  [id number]   MEDICAL RECORD NO.:  [id number]          PATIENT TYPE:  INP   LOCATION:  1426                         FACILITY:  [facility]   PHYSICIAN:  [name], M.D.   DATE OF BIRTH:  November 21, 1940   DATE OF ADMISSION:  07/27/2008  DATE OF DISCHARGE:  07/29/2008                               DISCHARGE SUMMARY   PRIMARY CARE PHYSICIAN:  [name] [address].   PULMONOLOGIST:  Dr. Jetty Duhamel.   DISCHARGE DIAGNOSES:  1. Community-acquired pneumonia.  2. Chronic obstructive pulmonary disease with acute exacerbation.  3. Steroid-induced hyperglycemia.  4. Benign prostatic hypertrophy.  5. Dyspnea secondary to #1 and #2.   DISCHARGE MEDICATIONS:  1. Avelox 400 mg daily x5 more days.  2. Prednisone 60 mg tapered to off over 7 days.  3. Mucinex OTC 600 mg b.i.d. p.r.n.  4. Flomax 0.4 mg daily.  5. Albuterol 2 puffs q.i.d. and q.2 hours p.r.n.  6. Spiriva 1 inhalation daily.   CONSULTATIONS:  None.   BRIEF ADMISSION HPI:  The patient is a 70 year old male who presented to  the hospital with a chief complaint of worsening shortness of breath.  He denied any fever.  He had a cough with scanty sputum production and  was admitted for further evaluation and workup.  For full details,  please see the dictated report done by [name].   PROCEDURES AND DIAGNOSTIC STUDIES:  1. Chest x-ray on July 26, 2008 showed chronic lung changes with      increased densities in the left lung base.  2. CT angiogram on July 26, 2008 showed no evidence of acute      pulmonary embolus.  Round focus of air space consolidation in the      lingula, favor pneumonia/inflammatory process.  Radiographic      followup recommended to evaluate resolution.  Otherwise stable      appearance of the chest including postoperative changes to the      right lung and emphysema.   DISCHARGE LABORATORY VALUES:  BNP was 87.6.  CBC showed a white blood  cell count  of 14.1, hemoglobin 12, hematocrit 34.8, platelets 173,000.  Sodium was 135, potassium 4.1, chloride 105, bicarb 22, BUN 19,  creatinine 0.84, glucose 166.  Hemoglobin A1c was 5.3.  TSH was 2.547.   HOSPITAL COURSE BY PROBLEM:  1. Community acquired pneumonia.  The patient had a diagnostic      evaluation including the radiographic data as noted above.  He was      empirically put on Rocephin, azithromycin and Tamiflu.  At this      time, he has been afebrile and since he has not endorsed any      history of fevers, the Tamiflu is being discontinued.  He will      complete an additional 5 days of p.o. therapy with Avelox to      complete a total course of treatment for 7 days for his community-      acquired pneumonia.  He should follow up with either his primary  care physician or Dr. Maple [name] for evaluation in 1 to 2 weeks and      consideration of a repeat chest x-ray to ensure resolution of the      pneumonia.  2. Chronic obstructive pulmonary disease with acute exacerbation:  The      patient was empirically put on IV steroids.  At this time, he has      not had any wheezing and therefore, we are tapering him on      prednisone over the course of 6 days.  We will continue his      antibiotics as noted above and he will continue with his home      treatments with albuterol and Spiriva.  3. Steroid-induced hyperglycemia:  The patient was put on a      combination of Lantus and sliding scale insulin while in the      hospital.  A hemoglobin A1c value was not elevated indicating this      is an acute reaction to steroids.  His glycemic control should      normalize with withdrawal of the steroids.  He is instructed to      avoid concentrated sweets while on steroids.  4. Benign prostatic hypertrophy:  The patient has been asymptomatic on      treatment with Flomax.  Urine cultures were negative.  5. Dyspnea:  This was felt to be secondary to pneumonia and COPD.  A      BNP was  checked and was not found to be elevated, and therefore,      the patient does not need an inpatient two-dimensional      echocardiogram.   DISPOSITION:  The patient is medically stable and wishes to be  discharged home.  He should follow up with [name] or with Dr. Maple [name]  in 1 to 2 weeks' time.   Time spent coordinating care for discharge and discharge instruction  equals 35 minutes.      [name], M.D.  Electronically Signed     CR/MEDQ  D:  07/29/2008  T:  07/29/2008  Job:  [id number]   cc:   [name]  [address]   [name] D. [name], MD, FCCP, FACP  [facility]-Pulmonary Dept  [address]  [place]  [place] [address]

## 2010-12-13 NOTE — Op Note (Signed)
NAME:  [name], [name] NO.:  [id number]   MEDICAL RECORD NO.:  [id number]          PATIENT TYPE:  AMB   LOCATION:  DAY                          FACILITY:  [facility]   PHYSICIAN:  Anselm Pancoast. Weatherly, M.D.DATE OF BIRTH:  [birth year]   DATE OF PROCEDURE:  10/14/2007  DATE OF DISCHARGE:                               OPERATIVE REPORT   PREOPERATIVE DIAGNOSIS:  Left inguinal hernia.   POSTOPERATIVE DIAGNOSIS:  Left inguinal hernia, it is kind of a  combination of indirect/direct.   PROCEDURE:  Left inguinal herniorrhaphy.   ANESTHESIA:  General anesthesia, LOA tube.   HISTORY:  [name] is a 70 year old male who I first saw  approximately one year ago, when he was having symptoms with a left  inguinal hernia.  At that time he had lost his health care, was having  problems voiding and wanted to wait until financially he was in better  arrangements.  He then returned, and I saw him this next time on September 30, 2007.  He had been able to get on Flomax, which he found was helping  him void.  He has had a past history of lung cancer and he follows Dr.  Jetty Duhamel from that standpoint.  He has not had any problems with  recurrent carcinoma of the lung, and he is here today for the planned  procedure.  His health insurance, he says, is such that if he it is done  as an outpatient is more extensive than if he is admitted overnight.  I  am sure of that, but we will let the decision of whether he can void and  whether wants to go home afterwards to be determined postoperatively.   DESCRIPTION OF PROCEDURE:  Preoperatively he was given a gram of Ancef.  He was positioned on the OR table and the left side had been marked.  There is no change in his physical exam, and he had used an LOA tube for  induction of general anesthesia (Dr. Okey Dupre).  The tube was inserted with  ease.  Then the left groin area was first clipped and then prepped with  Betadine surgical solution, and  draped in a sterile manner.  A little  area of the ilioinguinal nerve area was infiltrated with 10 mL of 4%  Marcaine with adrenalin.  Then the inguinal incision area where the  incision was made was infiltrated, and then subcutaneous incision made.  The superficial veins x2 were identified, clamped, divided and ligated  with 5-0 Vicryl.  The external oblique aponeurosis was opened.  The  ilioinguinal nerve was protected and later placed with the cord  structures.  I then placed a Penrose drain around the cord structures.  You could see that he has a weakness of the floor, but also an indirect  hernia sac.  I separated the hernia sac from the cord structures and  opened the hernia sac.  There were 2 little globs of fat from the  omentum, and a little pedicle that were in the canal; these were  removed.  The little pedicle base  was tied with 4-0 Vicryl.  Then the  hernia sac was freed up circumferentially, so it could be ligated with a  2-0 Vicryl; and a second suture was placed just distal under direct  vision.  Care was made that the vas and cord structures were not  compromised.  Then the hernia sac and the little pedicles of omentum  were sent for permanent pathology exam.   Next, the floor was closed with a running 2-0 Prolene, reinforcing the  floor of the conjoined tendon to the shelving edge of the inguinal  ligament; enforcing the internal ring and then going back and tying the  2 tails together.  Next, a piece of Prolene mesh shaped like a sail slit  laterally was used to reinforce the floor, and sutured to the inguinal  ligament with a running 2-0 Prolene.  The 2 tails were placed around the  internal ring and sutured together laterally, also to the inguinal  ligament.  This mesh was lying flat without excessive tension, and then  a few sutures of 2-0 Prolene were sutured to the superior flap down and  interrupted sutures.  I put additional Marcaine laterally for immediate   postoperative pain, and also had placed some on the floor.  Then, the  cord structures were placed back in the normal position and the nerve  comes out through the internal ring area.  Then, the external oblique  was closed with a running 3-0 Vicryl.  Scarpa's fascia was closed with  interrupted 3-0 Vicryl, 4-0 Dexon subcuticular, Benzoin and Steri-Strips  on the skin.   The patient tolerated the procedure nicely.  The estimated blood loss  was minimal.  We let him make the decision in the recovery room whether  he wants to go home or spend the night.  I do want him to void before he  goes home, since he has problems with BPH.           ______________________________  Anselm Pancoast. Zachery Dakins, M.D.     WJW/MEDQ  D:  10/14/2007  T:  10/14/2007  Job:  [id number]   cc:   Veverly Fells. [place], M.D.  Fax: [phone]

## 2010-12-13 NOTE — Letter (Signed)
September 12, 2007   [name], MD, FCCP, FACP  [facility]-Pulmonary Dept  [address]  [place], [place] [address]   Re:  GIBBS, NAUGLE                DOB:  1940-10-04   Dear Levonne Spiller:   I saw the patient for followup of his lung cancer.  His chest x-ray is  stable.   His lungs are clear to auscultation and percussion.  Heart:  Regular  sinus rhythm, no murmurs.  His blood pressure is 141/85, pulse 85,  respirations 18, sats were 97%.   Overall, he is doing well, and I will see him back again in 6 months for  his chest x-ray.  Apparently, he recently had a death in his family,  losing his mother, and has been laid off from work so he is undergoing a  lot of stress.   Ines Bloomer, M.D.  Electronically Signed   DPB/MEDQ  D:  09/12/2007  T:  09/13/2007  Job:  [id number]

## 2010-12-13 NOTE — Assessment & Plan Note (Signed)
OFFICE VISIT   [name], [name]  DOB:  Jan 16, 1941                                        March 11, 2008  CHART #:  [id number]   The patient came today.  His CT scan showed no evidence of recurrence.  Now, it is one and a half years since his surgery.  We will see him back  in 6 months with another CT scan.  There was some question of a slight  increase in his precarinal node, so we will continue to watch that.  His  blood pressure is 119/75, pulse 68, respirations 18, and sats were 97%.  He complained that he may have something in his right ear and on  examination, there is lot of cerumen in that area.  I tried to remove  it, but I could not and did not have the instruments and told him  heought to probably use some Cerumenex or see his medical doctor.   Ines Bloomer, M.D.  Electronically Signed   DPB/MEDQ  D:  03/11/2008  T:  03/12/2008  Job:  [id number]

## 2010-12-14 ENCOUNTER — Ambulatory Visit (INDEPENDENT_AMBULATORY_CARE_PROVIDER_SITE_OTHER): Payer: Medicare Other | Admitting: Internal Medicine

## 2010-12-14 DIAGNOSIS — R0602 Shortness of breath: Secondary | ICD-10-CM

## 2010-12-14 LAB — PULMONARY FUNCTION TEST

## 2010-12-14 NOTE — Progress Notes (Signed)
PFT done today. 

## 2010-12-15 ENCOUNTER — Ambulatory Visit (INDEPENDENT_AMBULATORY_CARE_PROVIDER_SITE_OTHER): Payer: Medicare Other | Admitting: Internal Medicine

## 2010-12-15 ENCOUNTER — Encounter: Payer: Self-pay | Admitting: Internal Medicine

## 2010-12-15 VITALS — BP 112/72 | HR 77 | Ht 66.0 in | Wt 160.0 lb

## 2010-12-15 DIAGNOSIS — R0609 Other forms of dyspnea: Secondary | ICD-10-CM

## 2010-12-15 DIAGNOSIS — R609 Edema, unspecified: Secondary | ICD-10-CM

## 2010-12-15 DIAGNOSIS — R0989 Other specified symptoms and signs involving the circulatory and respiratory systems: Secondary | ICD-10-CM

## 2010-12-15 DIAGNOSIS — R0600 Dyspnea, unspecified: Secondary | ICD-10-CM

## 2010-12-15 DIAGNOSIS — J449 Chronic obstructive pulmonary disease, unspecified: Secondary | ICD-10-CM

## 2010-12-15 DIAGNOSIS — J984 Other disorders of lung: Secondary | ICD-10-CM

## 2010-12-15 MED ORDER — TIOTROPIUM BROMIDE MONOHYDRATE 18 MCG IN CAPS: 18.0000 ug | ORAL_CAPSULE | Freq: Every day | RESPIRATORY_TRACT | Status: DC

## 2010-12-15 NOTE — Patient Instructions (Addendum)
Sample Dulera 200-5      2 puffs and rinse mouth, twice daily instead of Advair, for comparison  Sample and script Spiriva  Referral to cardiology- Dx COPD, dyspnea, peripheral edema-      Consider echocardiogram  Looking for dyspnea beyond pulmonary.

## 2010-12-15 NOTE — Assessment & Plan Note (Addendum)
Discussed emphysema and ways to blunt dyspnea so he can dance.  We discussed Dulera compared with Advair and spiriva.  He would like cardiology evaluation because he is concerned there might also be a cardiac component to his exertional dyspnea. He is not describing angina or palpitation. Leg edema is likely cor pulmonale, rather than left heart failure. He is not yet oxygen dependent, but that will come.

## 2010-12-15 NOTE — Progress Notes (Signed)
  Subjective:    Patient ID: [name], male    DOB: [birth year], 70 y.o.   MRN: [id number]  HPI 12/15/10- 26 yoM former smoker, followed for COPD complicated by hx RUL AdenoCa/ lobectomy 2005. Last here 11/01/10. Note reviewed. He still feels more short of breath than is comfortable- easily fatigued with exertion and doesn't understand why with good oxygen levels.  Went down to [employer] to dance - not any harder than here. He came back with a cough and was given a cough pill. Advair sample seemed better than spiriva, but is not covered by [place] and he has lost job. Feet still swell. Denies chest pain, palpitation.  PFT today- Severe emphysema. FEV1/FVC 0.39, DLCO 0.36, insignif resp to bronchodilator.  Review of Systems Constitutional:   No weight loss, night sweats,  Fevers, chills, fatigue, lassitude. HEENT:   No headaches,  Difficulty swallowing,  Tooth/dental problems,  Sore throat,                No sneezing, itching, ear ache, nasal congestion, post nasal drip,   CV:  No chest pain, orthopnea, PND, anasarca, dizziness, palpitations  GI  No heartburn, indigestion, abdominal pain, nausea, vomiting, diarrhea, change in bowel habits, loss of appetite  Resp:.  No excess mucus, no productive cough,  No non-productive cough,  No coughing up of blood.  No change in color of mucus.  No wheezing.  Skin: no rash or lesions.  GU: no dysuria, change in color of urine, no urgency or frequency.  No flank pain.  MS:  No joint pain or swelling.  No decreased range of motion.  No back pain.  Psych:  No change in mood or affect. No depression or anxiety.  No memory loss.      Objective:   Physical Exam General- Alert, Oriented, Affect-appropriate, Distress- none acute  Skin- rash-none, lesions- none, excoriation- none  Lymphadenopathy- none  Head- atraumatic  Eyes- Gross vision intact, PERRLA, conjunctivae clear secretions  Ears- Hearing, canals, Tm - normal,  Nose- Clear,  No-  Septal dev, mucus, polyps, erosion, perforation   Throat- Mallampati II , mucosa clear , drainage- none, tonsils- atrophic  Neck- flexible , trachea midline, no stridor , thyroid nl, carotid no bruit  Chest - symmetrical excursion , unlabored     Heart/CV- RRR , no murmur , no gallop  , no rub, nl s1 s2                     - JVD- none , edema- none, stasis changes- none, varices- none     Lung- clear to P&A, diminished,                wheeze- none, cough- none , dullness-none, rub- none     Chest wall-  Abd- tender-no, distended-no, bowel sounds-present, HSM- no  Br/ Gen/ Rectal- Not done, not indicated  Extrem- cyanosis- none, clubbing, none, atrophy- none, strength- nl  Neuro- grossly intact to observation         Assessment & Plan:

## 2010-12-16 NOTE — Assessment & Plan Note (Signed)
[name]                             PULMONARY OFFICE NOTE   NAME:[name] [name] LEVARIO                       MRN:          [id number]  DATE:07/03/2006                            DOB:          1941-01-03    PULMONARY FOLLOWUP   PROBLEMS:  1. Chronic obstructive pulmonary disease.  2. Resection right upper lobe adenocarcinoma in 2005.   HISTORY:  He returns for followup saying albuterol has helped, but  Singulair did not.  Easy exertional dyspnea carrying groceries up  stairs.  By comparison, it surprises him that he can go [employer] and  do quite well.  I explained the difference in muscle use and the  advantage of warm-up related to the dancing experience, and we agreed  that it is much better to have trouble with the groceries, but to be  able to enjoy dancing.  He has had no acute events or changes.   MEDICATIONS:  Albuterol inhaler, herbal supplements.   DRUG INTOLERANCES:  CODEINE, ANTIHISTAMINES, and MORPHINE.   OBJECTIVE:  Weight 153 pounds, BP 108/64, pulse regular 62, room air  saturation 97%.  He looks quiet and comfortable.  I do not hear wheeze, rales, or rhonchi.  Work of breathing is not  increased.  There is no neck vein distension or stridor.  No adenopathy is found at  the neck or axillae.   PFT:  Severe obstructive airways disease with hyperinflation, air  trapping, and some response to bronchodilator.  FEV1 was 1.35 (49%), FVC  3.81 (97%), threshold 0.35.  FEV1 improved 12% with dilator.  Total lung  capacity was 114% of predicted.  Residual volume was increased.  Diffusion was reduced to 46% of predicted.   PLAN:  1. He is going to try Spiriva with samples and prescription, once      daily.  2. Schedule return 4 to 6 months, earlier p.r.n.     [name], MD, Tonny Bollman, FACP  Electronically Signed    CDY/MedQ  DD: 07/03/2006  DT: 07/04/2006  Job #: [id number]   cc:   Ines Bloomer, M.D.

## 2010-12-16 NOTE — Op Note (Signed)
NAME:  THAO, VANOVER NO.:  [id number]   MEDICAL RECORD NO.:  [id number]                   PATIENT TYPE:  INP   LOCATION:  2899                                 FACILITY:  [facility]   PHYSICIAN:  Ines Bloomer, M.D.              DATE OF BIRTH:  28-Aug-1940   DATE OF PROCEDURE:  09/10/2003  DATE OF DISCHARGE:                                 OPERATIVE REPORT   PREOPERATIVE DIAGNOSIS:  Right upper lobe lesion.   POSTOPERATIVE DIAGNOSIS:  Adenocarcinoma of the right upper lobe, severe  emphysema.   OPERATION PERFORMED:  Right VATS, mini thoracotomy, wedge resection of right  upper lobe lesion with node sampling, wedge resection, right middle lobe  lesion.   SURGEON:  Ines Bloomer, M.D.   ANESTHESIA:  General.   INDICATIONS FOR PROCEDURE:  This patient had a spiculated lesion in the  right upper lobe that was suggestive of possibly cancer.  He also had severe  bullous emphysema in the right upper lobe and poor pulmonary function tests  and continues to smoke.  After a long course of trying to get him to stop  smoking, he did reduce the smoking and was brought to the operating room to  remove the lesion.   DESCRIPTION OF PROCEDURE:  He was turned to the right lateral thoracotomy  position, underwent general anesthesia.  A dual lumen tube was inserted, he  was prepped and draped in the usual sterile manner.  Two trocar sites were  made in the anterior and posterior __________ seventh intercostal space, two  trocars were inserted.  A 30 degree scope was inserted and pictures were  taken showing severe bullous emphysema.  Lesion was seen in the posterior  segment of the right upper lobe.  A 6 to 7 cm incision was made over the  fifth intercostal space near the posterior axillary line.  The latissimus  was partially divided. The serratus was reflected anteriorly.  The fifth  intercostal space was entered.  The lesion was palpated in the posterior  segment of the right upper lobe and again, there were severe bullae in the  apex.  Using the TLC 75 stapler, a posterior segmentectomy was performed  using a wedge technique using a TLC stapler with a ParaGard reinforcement.  This was then done again with the EZ-45 stapler with ParaGard reinforcement  removing pretty much the whole posterior segment.  It was then sent for  frozen section which revealed adenocarcinoma with negative margins.  It was  elected not to do a formal lobectomy because of his severe bullous disease.  Dissection was started at the hilum and 10R and 4R nodes were dissected out.  No other nodes were seen.  These were biopsied and sent for permanent  section.  Two chest tubes were brought into the trocar sites and tied in  place with 0 silk.  The chest was closed with  two pericostals, #1 Vicryl in  the muscle layer.  Marcaine block was done in the usual fashion.  3-0 Vicryl  subcuticular stitch.  The patient was then transferred to the recovery room  in stable condition.                                               Ines Bloomer, M.D.    DPB/MEDQ  D:  09/10/2003  T:  09/10/2003  Job:  [id number]

## 2010-12-16 NOTE — Discharge Summary (Signed)
NAME:  [name], [name] NO.:  [id number]   MEDICAL RECORD NO.:  [id number]                   PATIENT TYPE:  INP   LOCATION:  2017                                 FACILITY:  [facility]   PHYSICIAN:  Ines Bloomer, M.D.              DATE OF BIRTH:  1940-10-28   DATE OF ADMISSION:  09/10/2003  DATE OF DISCHARGE:  09/17/2003                                 DISCHARGE SUMMARY   PRIMARY ADMISSION DIAGNOSIS:  Right upper lobe lung lesion.   ADDITIONAL/DISCHARGE DIAGNOSES:  1. Adenocarcinoma of the right upper lobe.  2. Severe emphysema.  3. Benign prostatic hypertrophy.  4. Postoperative supraventricular tachycardia.  5. Clostridium difficile colitis.   PROCEDURES PERFORMED:  Right VATS with mini thoracotomy. Wedge resection of  right upper lobe lesion,wedge resection of right middle lobe lesion, lymph  node sampling.   HISTORY:  The patient is a 70 year old white male who after a bought of  bronchitis last fall had a chest x-ray, which revealed three right-sided  lung lesions. A chest CT scan was performed and it was felt that two of  these lesions were likely to be secondary to scarring, however, there was a  right upper lobe lesion, which was suspicious for carcinoma. It measured at  8 mm and was spiculated. The patient has a previous history of heavy tobacco  use. He denies any symptoms of weight loss, shortness of breath, hemoptysis,  cough. He is referred to Dr. Edwyna Shell for further evaluation and treatment. It  was Dr. Scheryl Darter opinion that the patient should proceed with a VATS for  resection of the lesion at this time for diagnosis purposes. The patient was  agreeable to proceed.   HOSPITAL COURSE:  He was admitted to [facility] on September 10, 2003 and was taken to the operating room where he underwent a right VATS  with mini thoracotomy, right upper lobe and right middle lobe with  resection. Intraoperative frozen sections as well as  final pathology on the  two lesions confirmed an adenocarcinoma invasive and well-differentiated of  the right upper lobe. All margins were free of tumor. The right middle lobe  lesion showed benign lung parenchyma with pleural and subpleural fibrosis  and prominent emphysematous changes. All lymph nodes were negative. The  cancer was staged at T1N0. The patient tolerated the procedure well and was  transferred to unit 3300 in stable condition. Postoperatively he developed  frequent episodes of diarrhea. Initially this was treated conservatively and  stool softeners and laxatives were all discontinued, but the problem  persisted. A stool sample was sent for C. difficile toxin and this was  positive. He was started on Flagyl and has had improvement in his symptoms.  He has otherwise done fairly well. He has been slowly mobilized with the  mobility team as well as cardiac rehab team. By postoperative day two his  chest tubes were  draining minimally and no air leaks were noted. The tubes  were discontinued. Follow-up chest x-ray showed no evidence of pneumothorax.  He has been treated with aggressive pulmonary toilet measures and has been  weaned from supplemental oxygen. His only other postoperative complication  was an episode of supraventricular tachycardia, which was thought to be  atrial fibrillation. Again, this was treated conservatively initially with  replacement of his potassium and magnesium. He continued to have  intermittent episodes and was started on Lopressor as well as Lanoxin. Since  that time he has remained in normal sinus rhythm. Otherwise, he has done  well. He has been afebrile and other vital signs have been stable. His is  maintaining O2 saturations of greater than 90% on room air. His most recent  labs showed a hemoglobin 10.8, hematocrit 31.3,white count 5.6, platelets  185. Potassium 4.2, BUN 3, creatinine 0.7. He is tolerating a regular diet  at present. His  surgical incision sites are all healing well. A PA and  lateral chest x-ray will be obtained on September 17, 2003 and if his x-ray  looks okay and he is otherwise doing well with  a decrease in his diarrhea  he will hopefully be ready for discharge home on September 17, 2003.   DISCHARGE MEDICATIONS:  1. Darvocet-N 100 1-2 q.4h. p.r.n. for pain.  2. Lopressor 25 mg b.i.d.  3. Lanoxin 0.125 mg daily.  4. Nicoderm CQ 21 mg patch daily.  5. Flagyl 500 mg q.6h. times one more week.  6. Flomax 0.4 mg daily.   DISCHARGE INSTRUCTIONS:  He is to refrain from driving, heavy lifting, or  strenuous activity. He may continue daily walking and use of his incentive  spirometer. He is asked to shower daily and clean his incisions with soap  and water. He will continued the same preoperative diet.   DISCHARGE FOLLOW UP:  He is asked to follow up with Dr. Edwyna Shell in one week  in the [facility] office. He will have a chest x-ray at [facility] one hour prior to this appointment and should bring his films to our  office for Dr. Edwyna Shell to review. His chest tube sutures will be removed at  the time of this visit with Dr. Edwyna Shell. In the interim if he experiences any  problems or has questions he has been instructed to call our office  immediately.   Of note, the patient was also seen in consultation by oncology while in the  hospital and a brain scan and bone scan were both performed. It is felt that  there is no need for any additional adjuvant therapy at this point. He will  follow up with the oncologist as directed.      Coral Ceo, P.A.                        Ines Bloomer, M.D.    GC/MEDQ  D:  09/16/2003  T:  09/17/2003  Job:  [id number]   cc:   Joni Fears D. Young, M.D.  [address] [place]  [place] [address]  Fax: [phone]

## 2010-12-16 NOTE — H&P (Signed)
NAME:  [name], [name] NO.:  [id number]   MEDICAL RECORD NO.:  [id number]                   PATIENT TYPE:  INP   LOCATION:  NA                                   FACILITY:  [facility]   PHYSICIAN:  Ines Bloomer, M.D.              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   PULMONOLOGIST:  Rennis Chris. [name], M.D.   HISTORY OF PRESENT ILLNESS:  A 71 year old Caucasian male referred from Dr.  [name] for evaluation of a right upper lobe lung lesion.  After a bout of  bronchitis last fall, he had a chest x-ray which revealed three right lung  lesions.  CT scan showed that two of these lesions to most likely be  scarring, but the right upper lobe lesion was suspicious for carcinoma.  It  measured at 8 mm and was spiculated.  Prior to his evaluation by Dr. Edwyna [name],  he continued to smoke two packs of cigarettes a day but has remained active.  He has had no recent weight loss or hemoptysis.  There was evidence of COPD  on the CT scan.  He was evaluated by Dr. Edwyna [name] at the [facility] office on  June 24, 2003.  After examination of [name] and review of the  available records, [name] recommended proceeding with surgical resection  via a VATS approach, as he is not a good candidate for a needle biopsy due  to increased risk for a pneumothorax.  [name] was strongly encouraged to  stop smoking at least two weeks before scheduling surgery.  [name]  returned to the [facility] office on August 31, 2003.  He has reduced his  cigarettes to a half pack per day but has not been able to quit completely.  His chest x-ray was reviewed by Dr. Edwyna [name].  The lesion is still evident.  Dr. Edwyna [name] feels this probably a slow-growing carcinoma and again,  recommended surgical resection to include a VATS approach for a left  upper  lobe wedge resection and node sampling.  The procedure, risks and benefits  were all discussed with [name].  He agrees to proceed with surgery.   PAST MEDICAL HISTORY:  Significant only for BPH.   PAST SURGICAL HISTORY:  He had a cyst removed from his right wrist in the  1970s.   ALLERGIES:  He is not allergic to any medications; however, he is intolerant  of CODEINE.  It causes a crawling feeling as well as nightmares.  Also  intolerant of ANTIHISTAMINES.  They keep him awake.   MEDICATIONS:  1. Flomax 0.4 mg q.d.  Recently stopped and been replaced with saw palmetto     320 mg q.d.  2. Vitamin C.  3. Glucosamine chondroitin 500/400  mg q.d.   SOCIAL HISTORY:  [name] is divorced.  He has two grown children.  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  He is currently smoking approximately a  pack a day for 45+ years.  He has been trying a nicotine patch at home, and  it helps him some.  He would like to continue a nicotine patch while in the  hospital.  He drinks infrequently, probably 2-3 beers a week.   FAMILY HISTORY:  Unremarkable.   REVIEW OF SYSTEMS:  GENERAL:  He is feeling okay.  He has no recent changes  in his vision or hearing.  He does have some neck stiffness.  He wears eye  glasses.  He also has a partial plate and also the possibility of a loose  tooth in his upper jaw.  He does have dyspnea on exertion as well as a  chronic cough.  He says that he has had chronic bronchitis all his life.  He  does have occasional wheezing.  No complaints of chest pain.  He has lost  weight since a bout of GI distress, consisting mostly of diarrhea.  His  bowel functions are back to normal; however, his appetite is decreased.  GU:  He has some hesitancy and frequency.  No dizziness, syncope, or falls.  He  has some generalized joint stiffness, specifically in his knees and his  thumbs.  He does not have any complaints of claudication.   PHYSICAL EXAMINATION:  VITAL  SIGNS:  Blood pressure 115/78, heart rate 60.  He is 5 feet, 7 inches,  Approximately 120 pounds.  GENERAL:  A 70 year old Caucasian male in no acute distress.  HEENT:  Eyes:  PERRLA.  EOMI.  Oral mucosa is pink and moist.  NECK:  Full range of motion.  No carotid bruits, thyromegaly, or  lymphadenopathy.  LUNGS:  His breathing is unlabored.  His breath sounds are clear but  distant.  HEART:  His heart is in a regular rate and rhythm.  No murmurs.  ABDOMEN:  Flat with bowel sounds.  It is soft.  EXTREMITIES:  His lower extremities are without edema.  His bilateral feet  are well perfused.  NEUROLOGIC:  He is alert and oriented.  Cranial nerves II-XII are grossly  intact.  Gait is steady.  He has full range of motion in all of his  extremities.  His muscle strength is 5/5 in all of extremities.   Laboratory studies are pending and will include a CBC, BMET, PT/INR, and  urinalysis.   IMPRESSION:  This is a 70 year old man with a right upper lobe lung lesion.   PLAN:  For elective admission to [facility] in the care of Dr.  Edwyna [name] on September 10, 2003 for a right video-assisted thoracoscopy, right  upper lobe wedge resection, and node sampling.  We plan to start a nicotine  patch 21 mg after surgery.      [name], N.P.                  Ines Bloomer, M.D.    CTK/MEDQ  D:  09/08/2003  T:  09/08/2003  Job:  [id number]

## 2010-12-16 NOTE — Assessment & Plan Note (Signed)
[facility]                               PULMONARY OFFICE NOTE   NAME:[name] [name] LAMBERTSON                       MRN:          [id number]  DATE:05/08/2006                            DOB:          05/11/1941    PROBLEMS:  1. Chronic obstructive pulmonary disease.  2. Resection right upper lobe adenocarcinoma in 2005.   HISTORY:  This former smoker with COPD had had an adenocarcinoma resected  with a right VATS for right upper lobe and right middle lobe resection in  February of 2005 with all margins clear, staged at T1 N0.  Postoperatively  he had C. difficile colitis and paroxysmal supraventricular tachycardia,  both of which resolved.  He had no adjuvant or chemotherapy.  He had been a  2 pack per day smoker, but has now quit.  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  He lives in an apartment with a 2 story  stair climb.  Mother has asthma and he visits her in [place]  periodically.  The finances prevented return, but he now has insurance.  He  had been trying to sustain himself with herbal products.  Most days, he has  some cough and phlegm with white sputum aggravated by smell from new carpet  recently installed.  There has been no bloody or purulent discharge, no real  change in pattern.  No chest pain or palpitations.  No ankle edema.  His  breathing does not wake him at night.  He does recognize heartburn.  He says  his latest chest CT had a questionable area, so he is scheduled to have 1  more for followup, handled by Dr. Edwyna [name].  He goes dancing 2 times a week.  Some days he notices a little wheeze.  He has prostatism symptoms, so we are  going to avoid Spiriva.  His son likes Singulair and the patient wants to  try that himself.  We discussed options.   MEDICATIONS:  Herbal supplements.   DRUG INTOLERANCES:  CODEINE, ANTIHISTAMINES, and MORPHINE.   PHYSICIANS:  Dr. Edwyna [name].  Primary care at [facility].  He has seen Dr. Dagoberto [name] in the past.   OBJECTIVE:  Weight 154 pounds, BP 110/68, pulse regular 69, room air  saturation 97%.  Medium build.  No distress.  Light cough with a little throat-clearing-type  rattle.  There is a bit of coating on his tongue.  No inflammation otherwise.  I do  not find adenopathy.  Breath sounds are diminished. Expiratory phase is a little slow but  unlabored.  There is no dullness.  No pleural rub.  HEART:  Sounds are regular.  I do not hear murmur or gallop.  I cannot feel liver or spleen.  EXTREMITIES:  Without cyanosis, clubbing, edema, or tremor.   IMPRESSION:  1. Chronic obstructive pulmonary disease, probably moderately severe.  2. Status post right upper lobectomy/right middle lobectomy for      adenocarcinoma 2005.  3. Esophageal reflux.   PLAN:  1. Reflux precautions were discussed with  over-the-counter medications      suggested to start.  2. Pneumococcal vaccine.  3. Flu vaccine.  4. Sample of albuterol 2 puffs q.i.d. p.r.n.  5. Sample Singulair 10 mg for trial.  6. Consider home nebulizer machine.  7. Schedule pulmonary function test.  8. Return 2 months, earlier p.r.n.       [name] D. [name], MD, [facility], FACP      CDY/MedQ  DD:  05/08/2006  DT:  05/09/2006  Job #:  [id number]   cc:   [name] [name], M.D.  [facility]

## 2010-12-18 NOTE — Assessment & Plan Note (Signed)
Likely cor pulmonale

## 2010-12-27 ENCOUNTER — Encounter: Payer: Self-pay | Admitting: Internal Medicine

## 2010-12-28 ENCOUNTER — Encounter: Payer: Self-pay | Admitting: Cardiovascular Disease

## 2010-12-28 ENCOUNTER — Ambulatory Visit (INDEPENDENT_AMBULATORY_CARE_PROVIDER_SITE_OTHER): Payer: Medicare Other | Admitting: Cardiovascular Disease

## 2010-12-28 VITALS — BP 108/68 | HR 82 | Ht 66.0 in | Wt 156.0 lb

## 2010-12-28 DIAGNOSIS — R0600 Dyspnea, unspecified: Secondary | ICD-10-CM

## 2010-12-28 DIAGNOSIS — R0602 Shortness of breath: Secondary | ICD-10-CM

## 2010-12-28 NOTE — Progress Notes (Signed)
70 yo referred by Dr [name] for progressive dyspnea.  History of lung CA with RUlobectomy and middle lobe rescection via VATS by Dr Edwyna Shell in 2005.  F/U CT scans show no recurrence.  Has residual COPD.  Reviewed PFT;s from 5/16 and they are not that bad.  Not on home oxygen and resting sats are normal.  No previous history of CAD, LV dysfunction or CHF  Reviewed labs from 09/26/10  Hct 41.8 and BNP normal at 19.3  Indicates 6 minute walk test recently and sats only dropped to 92%.  Certainly need to evaluate RV/LV function.  ECG is normal except for RAD form lung disease.  Dyspnea progressive over the last year.  No cough sputum, SSCP.  Mild chronic LE edema since surgery.  Takes diuretic from time to time.    ROS: Denies fever, malais, weight loss, blurry vision, decreased visual acuity, cough, sputum, SOB, hemoptysis, pleuritic pain, palpitaitons, heartburn, abdominal pain, melena, lower extremity edema, claudication, or rash.   General: Affect appropriate Healthy:  appears stated age HEENT: normal Neck supple with no adenopathy JVP normal no bruits no thyromegaly Lungs clear with no wheezing and good diaphragmatic motion s/p Right lobectomy Heart:  S1/S2 no murmur,rub, gallop or click PMI normal Abdomen: benighn, BS positve, no tenderness, no AAA no bruit.  No HSM or HJR Distal pulses intact with no bruits Mild bilateral  edema Neuro non-focal Skin warm and dry No muscular weakness  Medications Current Outpatient Prescriptions  Medication Sig Dispense Refill  . albuterol (PROAIR HFA) 108 (90 BASE) MCG/ACT inhaler Inhale 2 puffs into the lungs 4 (four) times daily.        . Ascorbic Acid (VITAMIN C) 500 MG tablet Take 500 mg by mouth daily.        . finasteride (PROSCAR) 5 MG tablet Take 5 mg by mouth daily.        . furosemide (LASIX) 20 MG tablet Take 20 mg by mouth daily. If needed for swelling.       Marland Kitchen glucosamine-chondroitin 500-400 MG tablet Take 1 tablet by mouth daily.        Marland Kitchen  omeprazole (PRILOSEC) 20 MG capsule Take 1 tablet by mouth Daily.      . Tamsulosin HCl (FLOMAX) 0.4 MG CAPS Take 1 tablet by mouth Twice daily.      . theophylline (THEOPHYLLINE) 100 MG 12 hr tablet Take 100 mg by mouth 2 (two) times daily.        Marland Kitchen tiotropium (SPIRIVA HANDIHALER) 18 MCG inhalation capsule Place 1 capsule (18 mcg total) into inhaler and inhale daily.  30 capsule  prn  . DISCONTD: Fluticasone-Salmeterol (ADVAIR DISKUS) 250-50 MCG/DOSE AEPB 1 puff twice a day  180 each  3  . DISCONTD: tiotropium (SPIRIVA) 18 MCG inhalation capsule Place 18 mcg into inhaler and inhale daily.          Allergies Antihistamines, diphenhydramine-type; Codeine; and Morphine  Family History: Family History  Problem Relation Age of Onset  . Asthma Son     Social History: History   Social History  . Marital Status: Single    Spouse Name: N/A    Number of Children: N/A  . Years of Education: N/A   Occupational History  . 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 assistant in [employer]    Social History Main Topics  . Smoking status: Former Smoker -- 2.5 packs/day for 40 years    Types: Cigarettes    Quit date: 09/09/2002  .  Smokeless tobacco: Not on file  . Alcohol Use: Not on file  . Drug Use: Not on file  . Sexually Active: Not on file   Other Topics Concern  . Not on file   Social History Narrative  . No narrative on file    Electrocardiogram:    NSR 82 RAD otherwise normal ECG  Assessment and Plan

## 2010-12-28 NOTE — Patient Instructions (Signed)
Your physician recommends that you schedule a follow-up appointment in: AFTER TESTS DONE  Your physician recommends that you continue on your current medications as directed. Please refer to the Current Medication list given to you today.  Your physician has requested that you have an echocardiogram. Echocardiography is a painless test that uses sound waves to create images of your heart. It provides your doctor with information about the size and shape of your heart and how well your heart's chambers and valves are working. This procedure takes approximately one hour. There are no restrictions for this procedure.   Your physician has recommended that you have a cardiopulmonary stress test (CPX). CPX testing is a non-invasive measurement of heart and lung function. It replaces a traditional treadmill stress test. This type of test provides a tremendous amount of information that relates not only to your present condition but also for future outcomes. This test combines measurements of you ventilation, respiratory gas exchange in the lungs, electrocardiogram (EKG), blood pressure and physical response before, during, and following an exercise protocol.

## 2011-01-12 ENCOUNTER — Ambulatory Visit (HOSPITAL_BASED_OUTPATIENT_CLINIC_OR_DEPARTMENT_OTHER): Payer: Medicare Other | Admitting: Radiology

## 2011-01-12 ENCOUNTER — Ambulatory Visit (HOSPITAL_COMMUNITY): Payer: Medicare Other | Attending: Cardiovascular Disease

## 2011-01-12 DIAGNOSIS — R0602 Shortness of breath: Secondary | ICD-10-CM

## 2011-01-12 DIAGNOSIS — J4489 Other specified chronic obstructive pulmonary disease: Secondary | ICD-10-CM | POA: Insufficient documentation

## 2011-01-12 DIAGNOSIS — R0989 Other specified symptoms and signs involving the circulatory and respiratory systems: Secondary | ICD-10-CM | POA: Insufficient documentation

## 2011-01-12 DIAGNOSIS — J449 Chronic obstructive pulmonary disease, unspecified: Secondary | ICD-10-CM | POA: Insufficient documentation

## 2011-01-12 DIAGNOSIS — R0600 Dyspnea, unspecified: Secondary | ICD-10-CM

## 2011-01-12 DIAGNOSIS — R0609 Other forms of dyspnea: Secondary | ICD-10-CM | POA: Insufficient documentation

## 2011-01-12 DIAGNOSIS — I379 Nonrheumatic pulmonary valve disorder, unspecified: Secondary | ICD-10-CM | POA: Insufficient documentation

## 2011-01-17 ENCOUNTER — Encounter: Payer: Self-pay | Admitting: Cardiovascular Disease

## 2011-01-18 ENCOUNTER — Ambulatory Visit (INDEPENDENT_AMBULATORY_CARE_PROVIDER_SITE_OTHER): Payer: Medicare Other | Admitting: Cardiovascular Disease

## 2011-01-18 ENCOUNTER — Encounter: Payer: Self-pay | Admitting: Cardiovascular Disease

## 2011-01-18 VITALS — BP 132/77 | HR 81 | Ht 66.0 in | Wt 157.0 lb

## 2011-01-18 DIAGNOSIS — R0602 Shortness of breath: Secondary | ICD-10-CM

## 2011-01-18 NOTE — Patient Instructions (Signed)
Your physician recommends that you schedule a follow-up appointment in: 6 MONTHS WITH DR NISHAN  Your physician recommends that you continue on your current medications as directed. Please refer to the Current Medication list given to you today. 

## 2011-01-18 NOTE — Progress Notes (Signed)
70 yo referred by Dr [name] for progressive dyspnea. History of lung CA with RUlobectomy and middle lobe rescection via VATS by Dr Edwyna Shell in 2005. F/U CT scans show no recurrence. Has residual COPD. Reviewed PFT;s from 5/16 and they are not that bad. Not on home oxygen and resting sats are normal. No previous history of CAD, LV dysfunction or CHF Reviewed labs from 09/26/10 Hct 41.8 and BNP normal at 19.3 Indicates 6 minute walk test recently and sats only dropped to 92%. Certainly need to evaluate RV/LV function. ECG is normal except for RAD form lung disease. Dyspnea progressive over the last year. No cough sputum, SSCP. Mild chronic LE edema since surgery. Takes diuretic from time to time.   Reviewed echo from 6/14 and EF normal with no evidence of pulmonary hypertension.   CPX not officially read yet but discussed with Renae Fickle.  Appears to have pulmonary limitations  No indication for cath.  Dyspnea appears solely from pulmonary diseae.    ROS: Denies fever, malais, weight loss, blurry vision, decreased visual acuity, cough, sputum, , hemoptysis, pleuritic pain, palpitaitons, heartburn, abdominal pain, melena, lower extremity edema, claudication, or rash.  All other systems reviewed and negative  General: Affect appropriate Healthy:  appears stated age HEENT: normal Neck supple with no adenopathy JVP normal no bruits no thyromegaly Lungs clear with no wheezing and good diaphragmatic motion S/P lobectomy Heart:  S1/S2 no murmur,rub, gallop or click PMI normal Abdomen: benighn, BS positve, no tenderness, no AAA no bruit.  No HSM or HJR Distal pulses intact with no bruits No edema Neuro non-focal Skin warm and dry No muscular weakness   Current Outpatient Prescriptions  Medication Sig Dispense Refill  . albuterol (PROAIR HFA) 108 (90 BASE) MCG/ACT inhaler Inhale 2 puffs into the lungs 4 (four) times daily.        . Ascorbic Acid (VITAMIN C) 500 MG tablet Take 500 mg by mouth daily.          . finasteride (PROSCAR) 5 MG tablet Take 5 mg by mouth daily.        . furosemide (LASIX) 20 MG tablet Take 20 mg by mouth daily. If needed for swelling.       Marland Kitchen glucosamine-chondroitin 500-400 MG tablet Take 1 tablet by mouth daily.        . Mometasone Furo-Formoterol Fum (DULERA IN) Inhale into the lungs.        Marland Kitchen omeprazole (PRILOSEC) 20 MG capsule Take 1 tablet by mouth Daily.      . Tamsulosin HCl (FLOMAX) 0.4 MG CAPS Take 1 tablet by mouth Twice daily.      . theophylline (THEOPHYLLINE) 100 MG 12 hr tablet Take 100 mg by mouth 2 (two) times daily.        Marland Kitchen tiotropium (SPIRIVA HANDIHALER) 18 MCG inhalation capsule Place 1 capsule (18 mcg total) into inhaler and inhale daily.  30 capsule  prn    Allergies  Antihistamines, diphenhydramine-type; Codeine; and Morphine  Electrocardiogram:  Assessment and Plan

## 2011-01-18 NOTE — Assessment & Plan Note (Signed)
Appears to be primarily related to lung disease  Echo with normal EF and no evidence of cor pulmonale.  CPX preliminary also appears to be limited by lung capacity not cardiac output.   F/U with Dr [name]

## 2011-03-08 NOTE — Progress Notes (Signed)
  Subjective:    Patient ID: [name], male    DOB: July 09, 1941, 70 y.o.   MRN: [id number]  HPI Document 6 minute walk test   Review of Systems     Objective:   Physical Exam        Assessment & Plan:

## 2011-03-09 ENCOUNTER — Other Ambulatory Visit: Payer: Self-pay | Admitting: Internal Medicine

## 2011-03-11 DIAGNOSIS — J449 Chronic obstructive pulmonary disease, unspecified: Secondary | ICD-10-CM | POA: Insufficient documentation

## 2011-03-11 DIAGNOSIS — J4489 Other specified chronic obstructive pulmonary disease: Secondary | ICD-10-CM | POA: Insufficient documentation

## 2011-03-11 DIAGNOSIS — R0602 Shortness of breath: Secondary | ICD-10-CM | POA: Insufficient documentation

## 2011-03-12 ENCOUNTER — Emergency Department (HOSPITAL_BASED_OUTPATIENT_CLINIC_OR_DEPARTMENT_OTHER)
Admission: EM | Admit: 2011-03-12 | Discharge: 2011-03-12 | Disposition: A | Discharge status: Home / Self Care | Payer: Medicare Other | Attending: Emergency Medicine | Admitting: Emergency Medicine

## 2011-03-12 ENCOUNTER — Emergency Department (INDEPENDENT_AMBULATORY_CARE_PROVIDER_SITE_OTHER): Payer: Medicare Other

## 2011-03-12 ENCOUNTER — Encounter (HOSPITAL_BASED_OUTPATIENT_CLINIC_OR_DEPARTMENT_OTHER): Payer: Self-pay | Admitting: *Deleted

## 2011-03-12 DIAGNOSIS — R0602 Shortness of breath: Secondary | ICD-10-CM

## 2011-03-12 DIAGNOSIS — R05 Cough: Secondary | ICD-10-CM

## 2011-03-12 DIAGNOSIS — Z85118 Personal history of other malignant neoplasm of bronchus and lung: Secondary | ICD-10-CM

## 2011-03-12 DIAGNOSIS — J449 Chronic obstructive pulmonary disease, unspecified: Secondary | ICD-10-CM

## 2011-03-12 LAB — CBC
HCT: 40.2 % (ref 39.0–52.0)
Hemoglobin: 13.8 g/dL (ref 13.0–17.0)
RBC: 4.84 MIL/uL (ref 4.22–5.81)
RDW: 12.9 % (ref 11.5–15.5)
WBC: 7.1 10*3/uL (ref 4.0–10.5)

## 2011-03-12 LAB — BASIC METABOLIC PANEL
CO2: 25 mEq/L (ref 19–32)
Chloride: 99 mEq/L (ref 96–112)
Creatinine, Ser: 1 mg/dL (ref 0.50–1.35)
Sodium: 134 mEq/L — ABNORMAL LOW (ref 135–145)

## 2011-03-12 LAB — DIFFERENTIAL
Basophils Absolute: 0 10*3/uL (ref 0.0–0.1)
Lymphocytes Relative: 12 % (ref 12–46)
Monocytes Absolute: 0.6 10*3/uL (ref 0.1–1.0)
Neutro Abs: 5.6 10*3/uL (ref 1.7–7.7)

## 2011-03-12 MED ORDER — DEXTROSE 5 % IV SOLN
500.0000 mg | Freq: Once | INTRAVENOUS | Status: AC
  Administered 2011-03-12: 500 mg via INTRAVENOUS
  Filled 2011-03-12: qty 500

## 2011-03-12 MED ORDER — ALBUTEROL SULFATE (5 MG/ML) 0.5% IN NEBU
5.0000 mg | INHALATION_SOLUTION | Freq: Once | RESPIRATORY_TRACT | Status: AC
  Administered 2011-03-12: 5 mg via RESPIRATORY_TRACT
  Filled 2011-03-12: qty 1

## 2011-03-12 MED ORDER — PREDNISONE 20 MG PO TABS: 60.0000 mg | ORAL_TABLET | Freq: Every day | ORAL | Status: AC

## 2011-03-12 MED ORDER — IPRATROPIUM BROMIDE 0.02 % IN SOLN
0.5000 mg | Freq: Once | RESPIRATORY_TRACT | Status: AC
  Administered 2011-03-12: 0.5 mg via RESPIRATORY_TRACT
  Filled 2011-03-12: qty 2.5

## 2011-03-12 MED ORDER — KETOROLAC TROMETHAMINE 30 MG/ML IJ SOLN
30.0000 mg | Freq: Once | INTRAMUSCULAR | Status: AC
  Administered 2011-03-12: 30 mg via INTRAVENOUS
  Filled 2011-03-12: qty 1

## 2011-03-12 MED ORDER — METHYLPREDNISOLONE SODIUM SUCC 125 MG IJ SOLR
125.0000 mg | Freq: Once | INTRAMUSCULAR | Status: AC
  Administered 2011-03-12: 125 mg via INTRAVENOUS
  Filled 2011-03-12: qty 2

## 2011-03-12 MED ORDER — AZITHROMYCIN 250 MG PO TABS: 250.0000 mg | ORAL_TABLET | Freq: Every day | ORAL | Status: AC

## 2011-03-12 NOTE — ED Notes (Signed)
Coughing, short of breath since friday

## 2011-03-12 NOTE — ED Provider Notes (Signed)
History     CSN: [id number] Arrival date & time: 03/12/2011 12:28 AM  Chief Complaint  Patient presents with  . Shortness of Breath   HPI Pt reports 2 days of increasing SOB, productive cough, not improved with meds at home. Has not had a fever. Has history of COPD and lung cancer s/p lobectomy. He denies any CP, has had nasal congestion, sinus pressure and headache recently as well. Headache is moderate diffuse and aching, worse with coughing.   Past Medical History  Diagnosis Date  . Cancer     lung ca 2005  . Prostate enlargement   . PNA (pneumonia)     as a child and 2004  . COPD (chronic obstructive pulmonary disease)   . Adenocarcinoma     RUL    Past Surgical History  Procedure Date  . Lobectomy     RUL 2005 no adjuvant  . Cyst on wrist   . Tonsillectomy     Family History  Problem Relation Age of Onset  . Asthma Son     History  Substance Use Topics  . Smoking status: Former Smoker -- 2.5 packs/day for 40 years    Types: Cigarettes    Quit date: 09/09/2002  . Smokeless tobacco: Never Used  . Alcohol Use: No      Review of Systems All other systems reviewed and are negative except as noted in HPI.    Physical Exam  BP 132/76  Pulse 98  Temp(Src) 98.5 F (36.9 C) (Oral)  Resp 24  SpO2 95%  Physical Exam  Nursing note and vitals reviewed. Constitutional: He is oriented to person, place, and time. He appears well-developed and well-nourished.  HENT:  Head: Normocephalic and atraumatic.  Eyes: EOM are normal. Pupils are equal, round, and reactive to light.  Neck: Normal range of motion. Neck supple.  Cardiovascular: Normal rate, normal heart sounds and intact distal pulses.   Pulmonary/Chest: Effort normal. He has rales (R base).       Coughing, decreased air movement without frank wheezing  Abdominal: Bowel sounds are normal. He exhibits no distension. There is no tenderness.  Musculoskeletal: Normal range of motion. He exhibits no edema and  no tenderness.  Neurological: He is alert and oriented to person, place, and time. No cranial nerve deficit.  Skin: Skin is warm and dry. No rash noted.  Psychiatric: He has a normal mood and affect.    ED Course  Procedures  MDM Labs and imaging unremarkable. Pt feeling much better. Will start zithromax, prednisone for COPD exacerbation. Continue home meds. Followup with Pulm.      Nicolaos Mitrano B. Bernette Mayers, MD 03/12/11 [phone]

## 2011-03-16 ENCOUNTER — Encounter: Payer: Self-pay | Admitting: Cardiovascular Disease

## 2011-03-18 DIAGNOSIS — IMO0001 Reserved for inherently not codable concepts without codable children: Secondary | ICD-10-CM | POA: Insufficient documentation

## 2011-03-18 DIAGNOSIS — N400 Enlarged prostate without lower urinary tract symptoms: Secondary | ICD-10-CM | POA: Insufficient documentation

## 2011-04-10 ENCOUNTER — Other Ambulatory Visit: Payer: Self-pay | Admitting: Thoracic Surgery

## 2011-04-10 DIAGNOSIS — D381 Neoplasm of uncertain behavior of trachea, bronchus and lung: Secondary | ICD-10-CM

## 2011-04-11 DIAGNOSIS — C3490 Malignant neoplasm of unspecified part of unspecified bronchus or lung: Secondary | ICD-10-CM

## 2011-04-12 ENCOUNTER — Encounter: Payer: Self-pay | Admitting: Thoracic Surgery

## 2011-04-12 ENCOUNTER — Ambulatory Visit (INDEPENDENT_AMBULATORY_CARE_PROVIDER_SITE_OTHER): Payer: Medicare Other | Admitting: Thoracic Surgery

## 2011-04-12 ENCOUNTER — Ambulatory Visit
Admission: RE | Admit: 2011-04-12 | Discharge: 2011-04-12 | Disposition: A | Discharge status: Home / Self Care | Payer: Medicare Other | Source: Ambulatory Visit | Attending: Thoracic Surgery | Admitting: Thoracic Surgery

## 2011-04-12 VITALS — BP 131/73 | HR 82 | Resp 18 | Ht 65.0 in | Wt 155.0 lb

## 2011-04-12 DIAGNOSIS — Z902 Acquired absence of lung [part of]: Secondary | ICD-10-CM

## 2011-04-12 DIAGNOSIS — C3490 Malignant neoplasm of unspecified part of unspecified bronchus or lung: Secondary | ICD-10-CM

## 2011-04-12 DIAGNOSIS — Z9889 Other specified postprocedural states: Secondary | ICD-10-CM

## 2011-04-12 DIAGNOSIS — D381 Neoplasm of uncertain behavior of trachea, bronchus and lung: Secondary | ICD-10-CM

## 2011-04-12 NOTE — Progress Notes (Signed)
HPI issue returns with a chest x-ray that shows no recurrence of his cancer. He is now 5-7 years since his surgery. He still has marked short shortness of breath secondary to his intrinsic lung disease. We will be followed by his medical Dr.   Current Outpatient Prescriptions  Medication Sig Dispense Refill  . albuterol (PROAIR HFA) 108 (90 BASE) MCG/ACT inhaler Inhale 2 puffs into the lungs 4 (four) times daily.        . Ascorbic Acid (VITAMIN C) 500 MG tablet Take 500 mg by mouth daily.        . finasteride (PROSCAR) 5 MG tablet Take 5 mg by mouth daily.        . furosemide (LASIX) 20 MG tablet Take 20 mg by mouth daily. If needed for swelling.       Marland Kitchen glucosamine-chondroitin 500-400 MG tablet Take 1 tablet by mouth daily.        . Mometasone Furo-Formoterol Fum (DULERA IN) Inhale into the lungs.        Marland Kitchen omeprazole (PRILOSEC) 20 MG capsule Take 1 tablet by mouth Daily.      . Tamsulosin HCl (FLOMAX) 0.4 MG CAPS Take 1 tablet by mouth Twice daily.      . THEOPHYLLINE 100 MG 12 hr tablet TKAE 1 TABLET BY MOUTH TWICE DAILY AFTER FOOD  60 tablet  1  . tiotropium (SPIRIVA HANDIHALER) 18 MCG inhalation capsule Place 1 capsule (18 mcg total) into inhaler and inhale daily.  30 capsule  prn     Review of Systems: No change   Physical Exam  Cardiovascular: Normal rate, regular rhythm, normal heart sounds and intact distal pulses.   Pulmonary/Chest: Effort normal. He has decreased breath sounds in the right upper field, the right middle field, the right lower field, the left upper field, the left middle field and the left lower field.     Diagnostic Tests: Chest x-ray shows normal postoperative change no recurrence of pneumonia or cancer   Impression: Status post resection of right middle lobe lung cancer chronic obstructive pulmonary disease   Plan: Followup Dr. [name] and PCP

## 2011-04-24 LAB — CBC
Platelets: 211
RDW: 13.2
WBC: 5

## 2011-04-24 LAB — COMPREHENSIVE METABOLIC PANEL
AST: 35
Albumin: 3.3 — ABNORMAL LOW
Alkaline Phosphatase: 65
Chloride: 109
GFR calc Af Amer: >60
Potassium: 4.2
Total Bilirubin: 0.8
Total Protein: 5.8 — ABNORMAL LOW

## 2011-04-24 LAB — DIFFERENTIAL
Basophils Absolute: 0
Basophils Relative: 0
Eosinophils Relative: 3
Lymphocytes Relative: 27
Monocytes Absolute: 0.3
Monocytes Relative: 7

## 2011-05-05 LAB — DIFFERENTIAL
Basophils Absolute: 0 10*3/uL (ref 0.0–0.1)
Basophils Relative: 0 % (ref 0–1)
Basophils Relative: 1 % (ref 0–1)
Eosinophils Absolute: 0 10*3/uL (ref 0.0–0.7)
Eosinophils Absolute: 0.1 10*3/uL (ref 0.0–0.7)
Eosinophils Relative: 0 % (ref 0–5)
Eosinophils Relative: 1 % (ref 0–5)
Lymphs Abs: 1.1 10*3/uL (ref 0.7–4.0)
Monocytes Absolute: 0.7 10*3/uL (ref 0.1–1.0)
Monocytes Relative: 6 % (ref 3–12)
Neutrophils Relative %: 83 % — ABNORMAL HIGH (ref 43–77)

## 2011-05-05 LAB — CBC
HCT: 34.8 % — ABNORMAL LOW (ref 39.0–52.0)
HCT: 35.3 % — ABNORMAL LOW (ref 39.0–52.0)
Hemoglobin: 14.3 g/dL (ref 13.0–17.0)
MCHC: 34.4 g/dL (ref 30.0–36.0)
MCHC: 34.6 g/dL (ref 30.0–36.0)
MCHC: 33.8 g/dL (ref 30.0–36.0)
MCV: 86.9 fL (ref 78.0–100.0)
MCV: 87.1 fL (ref 78.0–100.0)
MCV: 86.1 fL (ref 78.0–100.0)
Platelets: 171 10*3/uL (ref 150–400)
Platelets: 173 10*3/uL (ref 150–400)
RBC: 4 MIL/uL — ABNORMAL LOW (ref 4.22–5.81)
RBC: 4.9 MIL/uL (ref 4.22–5.81)
RDW: 13.3 % (ref 11.5–15.5)
RDW: 12.7 % (ref 11.5–15.5)
WBC: 11.4 10*3/uL — ABNORMAL HIGH (ref 4.0–10.5)
WBC: 14.1 10*3/uL — ABNORMAL HIGH (ref 4.0–10.5)

## 2011-05-05 LAB — URINALYSIS, ROUTINE W REFLEX MICROSCOPIC
Bilirubin Urine: NEGATIVE
Glucose, UA: 100 mg/dL — AB
Hgb urine dipstick: NEGATIVE
Protein, ur: NEGATIVE mg/dL
pH: 6 (ref 5.0–8.0)

## 2011-05-05 LAB — BASIC METABOLIC PANEL
BUN: 10 mg/dL (ref 6–23)
BUN: 19 mg/dL (ref 6–23)
CO2: 22 mEq/L (ref 19–32)
CO2: 27 mEq/L (ref 19–32)
Chloride: 102 mEq/L (ref 96–112)
Chloride: 105 mEq/L (ref 96–112)
Chloride: 101 mEq/L (ref 96–112)
Creatinine, Ser: 0.84 mg/dL (ref 0.4–1.5)
Creatinine, Ser: 0.91 mg/dL (ref 0.4–1.5)
GFR calc Af Amer: >60 mL/min (ref >60)
Glucose, Bld: 111 mg/dL — ABNORMAL HIGH (ref 70–99)
Glucose, Bld: 95 mg/dL (ref 70–99)
Potassium: 4.1 mEq/L (ref 3.5–5.1)
Potassium: 4.2 mEq/L (ref 3.5–5.1)
Sodium: 139 mEq/L (ref 135–145)

## 2011-05-05 LAB — POCT CARDIAC MARKERS: Myoglobin, poc: 77 ng/mL (ref 12–200)

## 2011-05-05 LAB — URINE CULTURE
Colony Count: NO GROWTH
Culture: NO GROWTH

## 2011-05-05 LAB — HEMOGLOBIN A1C: Hgb A1c MFr Bld: 5.3 % (ref 4.6–6.1)

## 2011-05-05 LAB — GLUCOSE, CAPILLARY
Glucose-Capillary: 157 mg/dL — ABNORMAL HIGH (ref 70–99)
Glucose-Capillary: 211 mg/dL — ABNORMAL HIGH (ref 70–99)

## 2011-05-05 LAB — B-NATRIURETIC PEPTIDE (CONVERTED LAB): Pro B Natriuretic peptide (BNP): 39.4 pg/mL (ref 0.0–100.0)

## 2011-05-05 LAB — TSH: TSH: 2.547 u[IU]/mL (ref 0.350–4.500)

## 2011-05-16 ENCOUNTER — Other Ambulatory Visit: Payer: Self-pay | Admitting: Internal Medicine

## 2011-05-26 IMAGING — CT CT ABDOMEN W/O CM
2 of 4 series · 16 of 46 positions shown, 18 images · non-contrast
Comparison: None

CT ABDOMEN

CLINICAL DATA: Right-sided abdominal and pelvic pain.

CT ABDOMEN AND PELVIS WITHOUT CONTRAST
TECHNIQUE: Multidetector CT imaging of the abdomen and pelvis was
performed following the standard protocol without intravenous
contrast.

[Series 2: abd/pelvis 5.0 b31f · axial · 0.70mm/px · z∈[+804,+1194]mm · 13 of 86 slices shown, 15 images]
[im 4/86  soft-tissue]
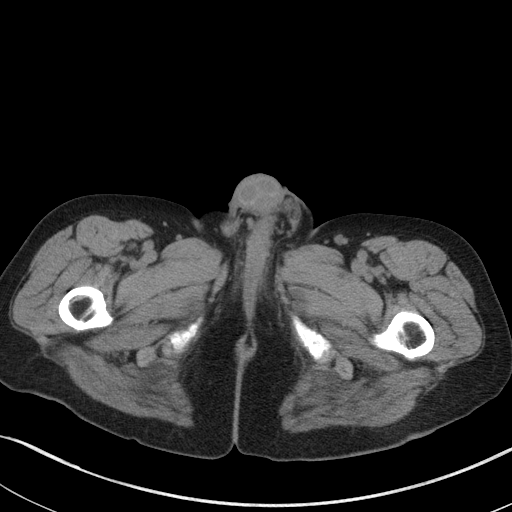
[im 4/86  bone]
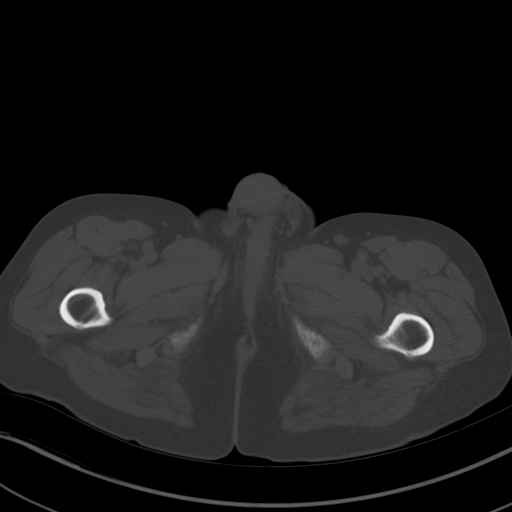
[im 11/86  soft-tissue]
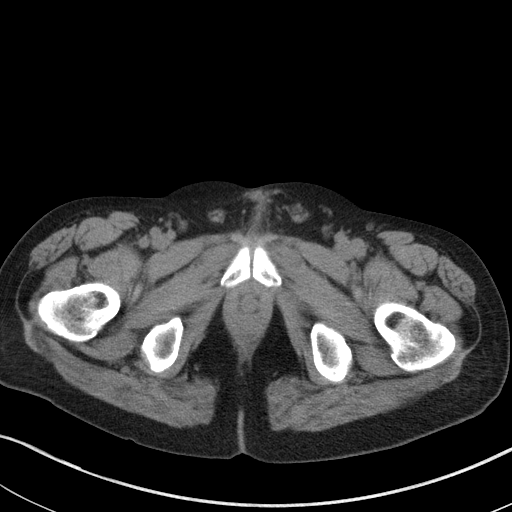
[im 18/86  soft-tissue]
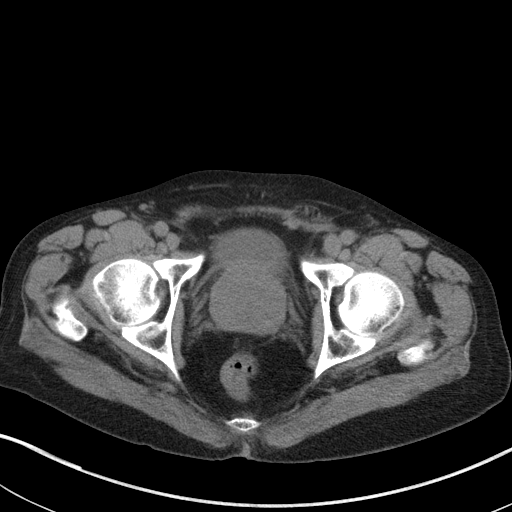
[im 25/86  soft-tissue]
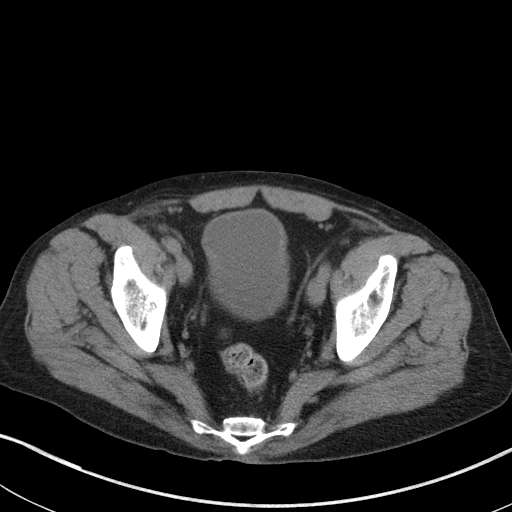
[im 29/86  soft-tissue]
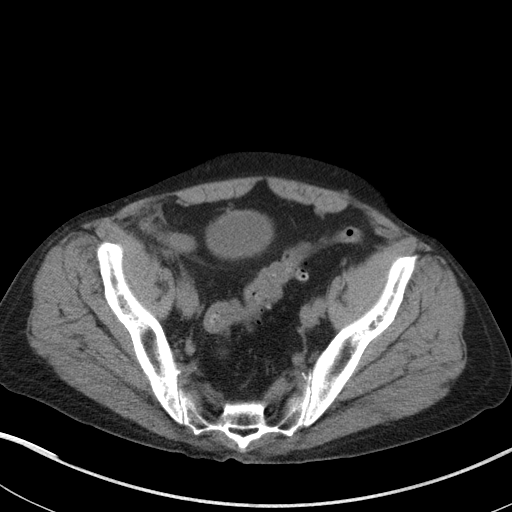
[im 36/86  soft-tissue]
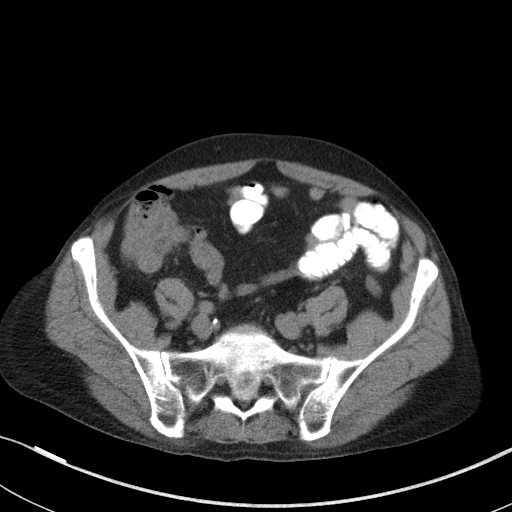
[im 43/86  soft-tissue]
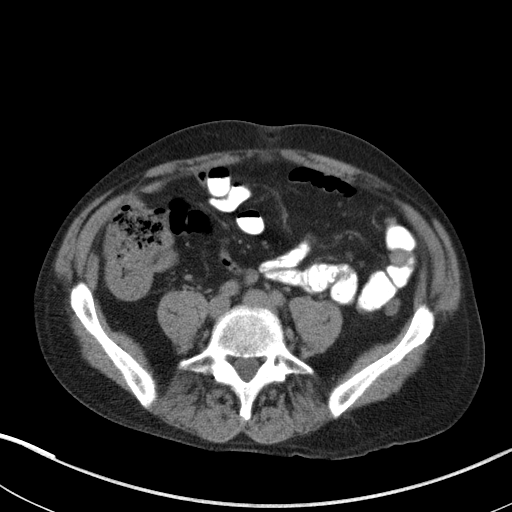
[im 50/86  soft-tissue]
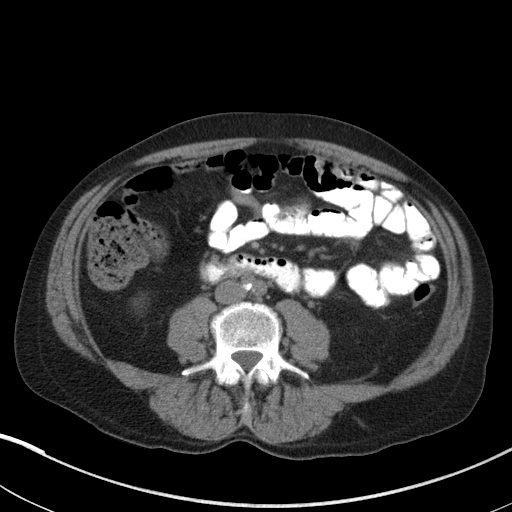
[im 57/86  soft-tissue]
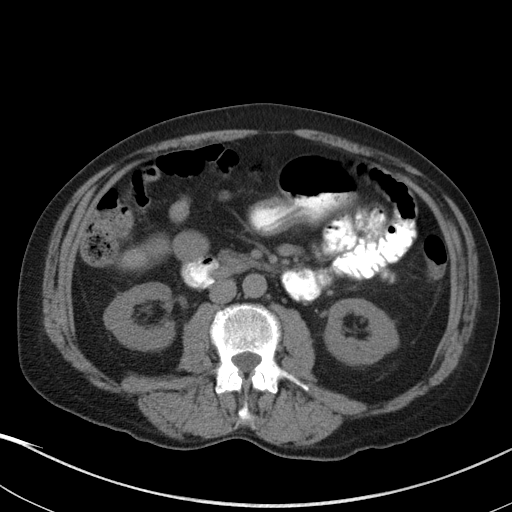
[im 57/86  bone]
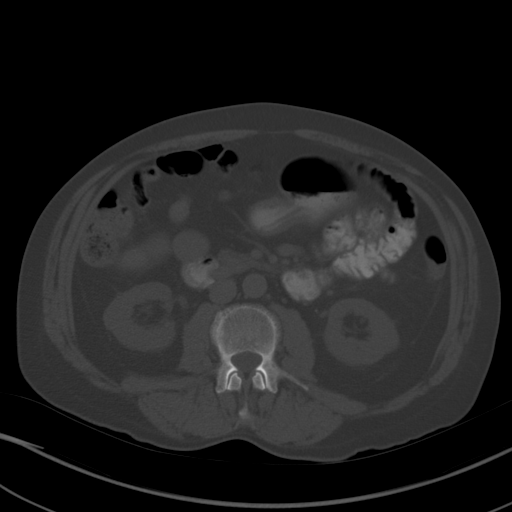
[im 61/86  soft-tissue]
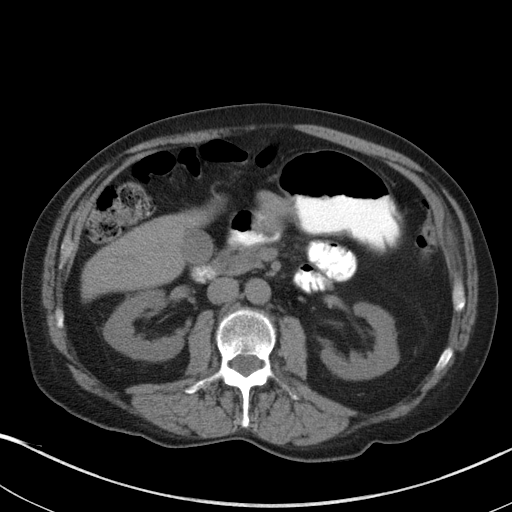
[im 68/86  soft-tissue]
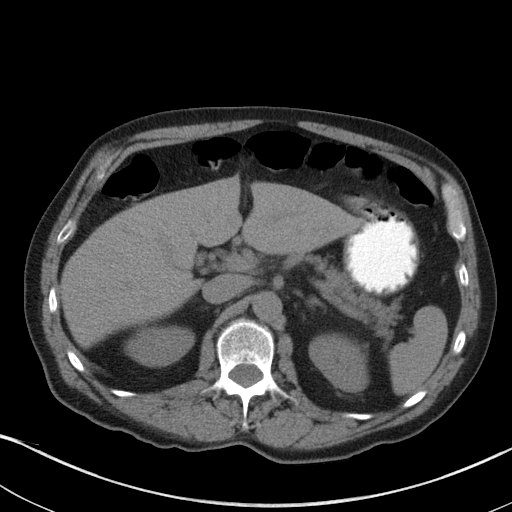
[im 75/86  soft-tissue]
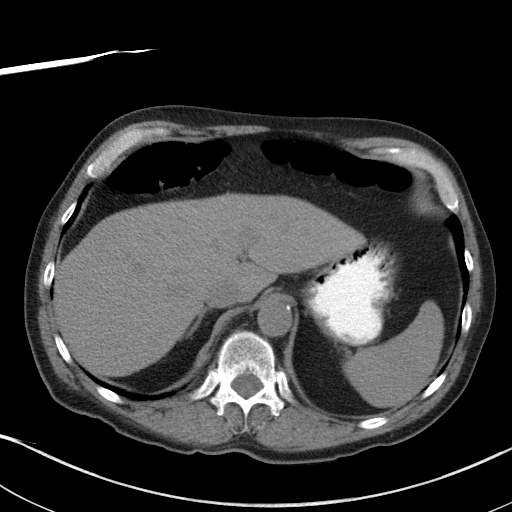
[im 82/86  soft-tissue]
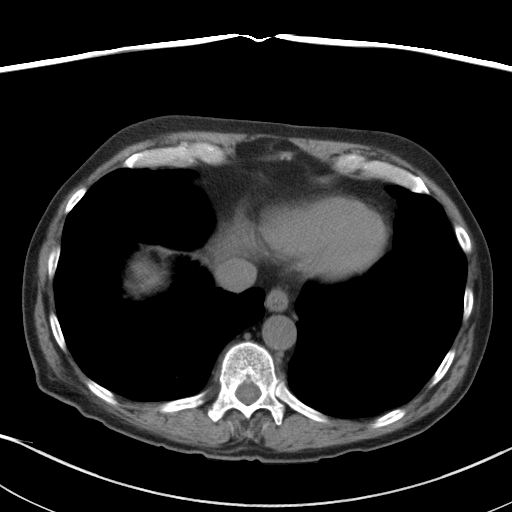

[Series 5: abd/pelvis 3.0 coronal · coronal · 0.66mm/px · 3 of 82 slices shown]
[im 28/82  soft-tissue]
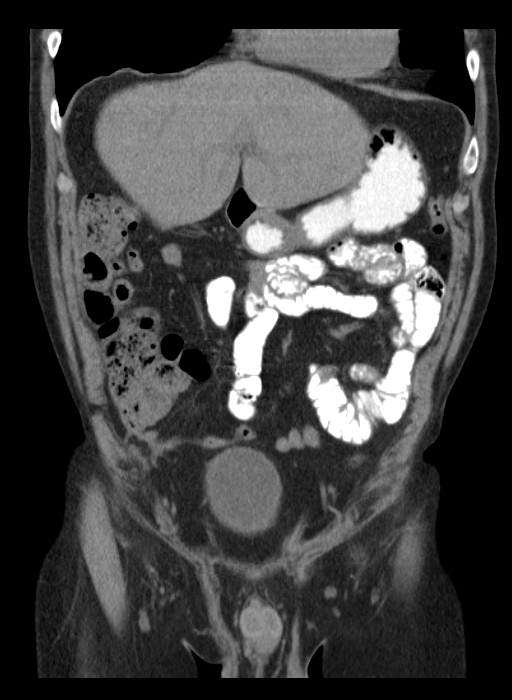
[im 37/82  soft-tissue]
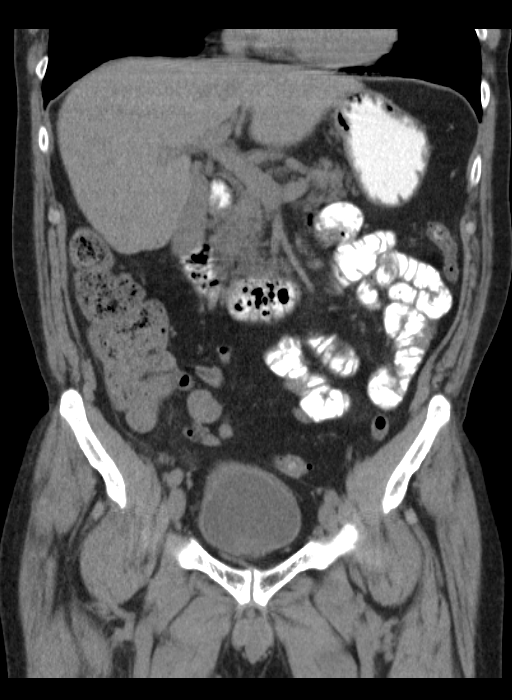
[im 46/82  soft-tissue]
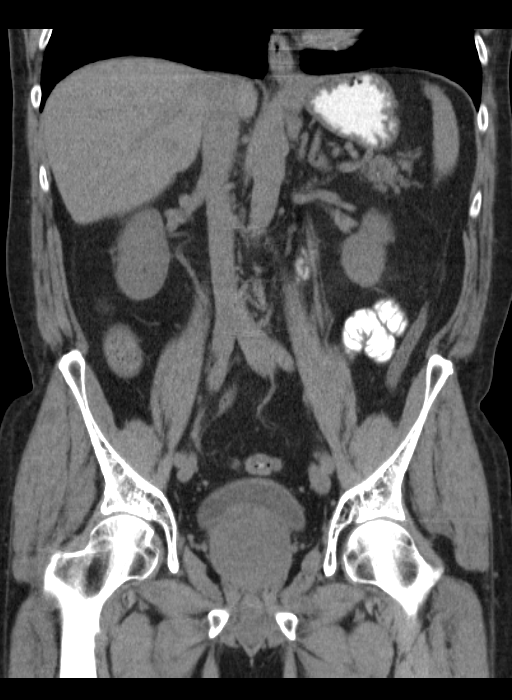

[16 of 46 positions shown; findings below may reference images not displayed]

FINDINGS: The liver, spleen, pancreas, kidneys, adrenal glands and
gallbladder are unremarkable.
Please note that parenchymal abnormalities may be missed as
intravenous contrast was not administered.
No free fluid, enlarged lymph nodes, biliary dilation or abdominal
aortic aneurysm identified.
The visualized bowel is unremarkable.
No acute or suspicious bony abnormalities are identified.
IMPRESSION: No evidence of acute abnormality.

CT PELVIS
FINDINGS: The appendix is enlarged with mild adjacent inflammation
compatible with appendicitis.
There is no evidence of free fluid, pneumoperitoneum, bowel
obstruction, or focal abscess.
Sigmoid diverticulosis is present without evidence of
diverticulitis.
Prostate enlargement is noted.
No acute or suspicious bony abnormalities are identified.
IMPRESSION: Appendicitis without free fluid or focal abscess.

Prostate enlargement.

Colonic diverticulosis without evidence of diverticulitis.

## 2011-06-19 ENCOUNTER — Encounter: Payer: Self-pay | Admitting: Internal Medicine

## 2011-06-19 ENCOUNTER — Ambulatory Visit (INDEPENDENT_AMBULATORY_CARE_PROVIDER_SITE_OTHER): Payer: Medicare Other | Admitting: Internal Medicine

## 2011-06-19 VITALS — BP 138/86 | HR 80 | Ht 66.0 in | Wt 160.6 lb

## 2011-06-19 DIAGNOSIS — J449 Chronic obstructive pulmonary disease, unspecified: Secondary | ICD-10-CM

## 2011-06-19 DIAGNOSIS — C3490 Malignant neoplasm of unspecified part of unspecified bronchus or lung: Secondary | ICD-10-CM

## 2011-06-19 NOTE — Patient Instructions (Signed)
Please call as needed 

## 2011-06-19 NOTE — Progress Notes (Signed)
Patient ID: [name], male    DOB: 05/20/1941, 70 y.o.   MRN: [id number]  HPI 12/15/10- 34 yoM former smoker, followed for COPD complicated by hx RUL AdenoCa/ lobectomy 2005. Last here 11/01/10. Note reviewed. He still feels more short of breath than is comfortable- easily fatigued with exertion and doesn't understand why with good oxygen levels.  Went down to [employer] to dance - not any harder than here. He came back with a cough and was given a cough pill. Advair sample seemed better than spiriva, but is not covered by [place] and he has lost job. Feet still swell. Denies chest pain, palpitation.  PFT today- Severe emphysema. FEV1/FVC 0.39, DLCO 0.36, insignif resp to bronchodilator.  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 Still goes dancing regularly. Cardiologist told him heart was okay and that shortness of breath was due to his lungs. He is now using Asmanex 220, Foradil, as provided by the [place]. There has been no change in his ankle edema which is not dependent. He has no history of DVT. Dr Edwyna Shell did a six-month followup chest x-ray, now 8 years postoperatively after his right upper lobectomy for cancer. The patient wants a chest x-ray at least once a year by me for ongoing long-term surveillance. We discussed chest x-ray versus CT scan. Chest x-ray: 04/12/2011-stable right middle lobe nodule and postoperative scarring. Images were reviewed with him.  Review of Systems-see HPI Constitutional:   No-   weight loss, night sweats, fevers, chills, fatigue, lassitude. HEENT:   No-  headaches, difficulty swallowing, tooth/dental problems, sore throat,       No-  sneezing, itching, ear ache, nasal congestion, post nasal drip,  CV:  No-   chest pain, orthopnea, PND, no change in swelling in lower extremities,                                    no-dizziness, palpitations Resp: No-  acute shortness of breath with  exertion or at rest.              No-   productive cough,  No non-productive cough,  No- coughing up of blood.              No-   change in color of mucus.  No- wheezing.   Skin: No-   rash or lesions. GI:  No-   heartburn, indigestion, abdominal pain, nausea, vomiting, diarrhea,                 change in bowel habits, loss of appetite GU: No-   dysuria, change in color of urine, no urgency or frequency.  No- flank pain. MS:  No-   joint pain or swelling.  No- decreased range of motion.  No- back pain. Neuro-     nothing unusual Psych:  No- change in mood or affect. No depression or anxiety.  No memory loss.      Objective:   Physical Exam General- Alert, Oriented, Affect-appropriate, Distress- none acute Skin- rash-none, lesions- none, excoriation- none Lymphadenopathy- none Head- atraumatic            Eyes- Gross vision intact, PERRLA, conjunctivae clear secretions            Ears- Hearing, canals-normal            Nose- Clear, no-Septal  dev, mucus, polyps, erosion, perforation             Throat- Mallampati II , mucosa clear , drainage- none, tonsils- atrophic Neck- flexible , trachea midline, no stridor , thyroid nl, carotid no bruit Chest - symmetrical excursion , unlabored           Heart/CV- RRR , no murmur , no gallop  , no rub, nl s1 s2                           - JVD- none , edema- 2+, stasis changes- none, varices- none           Lung-  diminished clear to P&A, wheeze- none, cough- none , dullness-none, rub- none           Chest wall-  Abd- tender-no, distended-no, bowel sounds-present, HSM- no Br/ Gen/ Rectal- Not done, not indicated Extrem- cyanosis- none, clubbing, none, atrophy- none, strength- nl Neuro- grossly intact to observation

## 2011-06-20 ENCOUNTER — Other Ambulatory Visit: Payer: Self-pay | Admitting: Internal Medicine

## 2011-06-20 NOTE — Assessment & Plan Note (Signed)
No evidence of recurrence now 8 years status post lobectomy for adenocarcinoma.

## 2011-06-20 NOTE — Assessment & Plan Note (Signed)
I don't see a significant change. We discussed maintenance use of Foradil once a day and Asmanex one puff daily. I would rather have him continue this instead of stopping medicines completely.

## 2011-09-20 ENCOUNTER — Ambulatory Visit (INDEPENDENT_AMBULATORY_CARE_PROVIDER_SITE_OTHER): Payer: Medicare Other | Admitting: Internal Medicine

## 2011-09-20 ENCOUNTER — Encounter: Payer: Self-pay | Admitting: Internal Medicine

## 2011-09-20 VITALS — BP 160/82 | HR 121 | Ht 66.0 in | Wt 172.6 lb

## 2011-09-20 DIAGNOSIS — J449 Chronic obstructive pulmonary disease, unspecified: Secondary | ICD-10-CM

## 2011-09-20 DIAGNOSIS — R609 Edema, unspecified: Secondary | ICD-10-CM

## 2011-09-20 MED ORDER — ALBUTEROL SULFATE (2.5 MG/3ML) 0.083% IN NEBU: 2.5000 mg | INHALATION_SOLUTION | Freq: Four times a day (QID) | RESPIRATORY_TRACT | Status: DC | PRN

## 2011-09-20 NOTE — Patient Instructions (Signed)
Order- DME Home Oximetry room air    Sleep, rest , exertion        Dx COPD   Order- DME  Nebulizer Compressor (may use own)                    rx albuterol 0.083%   # 25, 1 by neb 4 x daily as needed   Suggest otc steroid cream like Topicort or 1% hydrocortisone used as needed

## 2011-09-20 NOTE — Progress Notes (Signed)
Patient ID: [name], male    DOB: March 11, 1941, 71 y.o.   MRN: [id number]  HPI 12/15/10- 89 yoM former smoker, followed for COPD complicated by hx RUL AdenoCa/ lobectomy 2005. Last here 11/01/10. Note reviewed. He still feels more short of breath than is comfortable- easily fatigued with exertion and doesn't understand why with good oxygen levels.  Went down to [employer] to dance - not any harder than here. He came back with a cough and was given a cough pill. Advair sample seemed better than spiriva, but is not covered by [place] and he has lost job. Feet still swell. Denies chest pain, palpitation.  PFT today- Severe emphysema. FEV1/FVC 0.39, DLCO 0.36, insignif resp to bronchodilator.  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 Still goes dancing regularly. Cardiologist told him heart was okay and that shortness of breath was due to his lungs. He is now using Asmanex 220, Foradil, as provided by the [place]. There has been no change in his ankle edema which is not dependent. He has no history of DVT. Dr Edwyna Shell did a six-month followup chest x-ray, now 8 years postoperatively after his right upper lobectomy for cancer. The patient wants a chest x-ray at least once a year by me for ongoing long-term surveillance. We discussed chest x-ray versus CT scan. Chest x-ray: 04/12/2011-stable right middle lobe nodule and postoperative scarring. Images were reviewed with him.  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 Treated with antibiotics and prednisone tapers. Has 3 more days on current taper. Head chest x-ray February 16 showing chronic right middle lobe scarring, pectus excavatum. His exercise was dancing and he hasn't been  able to dance because of shortness of breath. Blames lack of exercise and repeated prednisone tapers for 12 pound weight gain and admits he is eating more. We discussed steroid side effects. Wakes with malaise in the morning, weak, scant cough, nasal congestion. Denies sore throat, fever, purulent sputum, palpitation, chest pain. Can't afford pulmonary rehabilitation not covered by Medicare. Asks cheap alternative for Pramosone steroid cream for rectal itch.  Review of Systems-see HPI Constitutional:   No-   weight loss, night sweats, fevers, chills, fatigue, lassitude. HEENT:   No-  headaches, difficulty swallowing, tooth/dental problems, sore throat,       No-  sneezing, itching, ear ache, nasal congestion, post nasal drip,  CV:  No-   chest pain, orthopnea, PND, no change in swelling in lower extremities, no-dizziness, palpitations Resp: + shortness of breath with exertion or at rest.                + productive cough,  No non-productive cough,  No- coughing up of blood.              No-   change in color of mucus.  No- wheezing.   Skin: No-   rash or lesions. GI:  No-   heartburn, indigestion, abdominal pain, nausea, vomiting, diarrhea,                 change in bowel habits, loss of appetite, + rectal itching-chronic GU: No-   dysuria,  MS:  No-   joint pain or swelling.  Marland Kitchen  No- back pain.  Neuro-     nothing unusual Psych:  No- change in mood or affect. No depression or anxiety.  No memory loss.      Objective:   Physical Exam General- Alert, Oriented, Affect-appropriate, Distress- none acute Skin- rash-none, lesions- none, excoriation- none Lymphadenopathy- none Head- atraumatic            Eyes- Gross vision intact, PERRLA, conjunctivae clear secretions            Ears- Hearing, canals-normal            Nose- Clear, no-Septal dev, mucus, polyps, erosion, perforation             Throat- Mallampati II , mucosa clear , drainage- none, tonsils- atrophic Neck- flexible , trachea  midline, no stridor , thyroid nl, carotid no bruit Chest - symmetrical excursion , unlabored           Heart/CV- RRR rapid , no murmur , no gallop  , no rub, nl s1 s2                           - JVD- none , edema- 2+, stasis changes- none, varices- none           Lung-  diminished clear to P&A, wheeze- none, + mild dry cough , dullness-none, rub- none           Chest wall-  Abd- tender-no, distended-no, bowel sounds-present, HSM- no Br/ Gen/ Rectal- Not done, not indicated Extrem- cyanosis- none, clubbing, none, atrophy- none, strength- nl, heavy legs, Homan's-Neg Neuro- grossly intact to observation

## 2011-09-21 MED ORDER — LEVALBUTEROL HCL 0.63 MG/3ML IN NEBU
0.6300 mg | INHALATION_SOLUTION | Freq: Once | RESPIRATORY_TRACT | Status: AC
  Administered 2011-09-21: 0.63 mg via RESPIRATORY_TRACT

## 2011-09-21 NOTE — Assessment & Plan Note (Signed)
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 He is already had a lot of steroid and antibiotics with no definite target. We considered heart failure, angina equivalent, PE. Doubt those at this time. He has access to a nebulizer machine so we discussed providing medication. Plan-home nebulizer. He asks a neb treatment today. Home oximetry for reassessment.

## 2011-09-21 NOTE — Assessment & Plan Note (Signed)
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

## 2011-10-26 ENCOUNTER — Other Ambulatory Visit: Payer: Self-pay | Admitting: *Deleted

## 2011-10-26 MED ORDER — THEOPHYLLINE ER 100 MG PO TB12: 100.0000 mg | ORAL_TABLET | Freq: Two times a day (BID) | ORAL | Status: DC

## 2011-11-30 DIAGNOSIS — K5900 Constipation, unspecified: Secondary | ICD-10-CM | POA: Insufficient documentation

## 2011-12-19 ENCOUNTER — Ambulatory Visit: Payer: Medicare Other | Admitting: Internal Medicine

## 2011-12-20 ENCOUNTER — Ambulatory Visit (INDEPENDENT_AMBULATORY_CARE_PROVIDER_SITE_OTHER): Payer: Medicare Other | Admitting: Internal Medicine

## 2011-12-20 ENCOUNTER — Encounter: Payer: Self-pay | Admitting: Internal Medicine

## 2011-12-20 ENCOUNTER — Telehealth: Payer: Self-pay | Admitting: Internal Medicine

## 2011-12-20 VITALS — BP 112/64 | HR 100 | Ht 66.0 in | Wt 165.6 lb

## 2011-12-20 DIAGNOSIS — R609 Edema, unspecified: Secondary | ICD-10-CM

## 2011-12-20 DIAGNOSIS — C3490 Malignant neoplasm of unspecified part of unspecified bronchus or lung: Secondary | ICD-10-CM

## 2011-12-20 DIAGNOSIS — J449 Chronic obstructive pulmonary disease, unspecified: Secondary | ICD-10-CM

## 2011-12-20 MED ORDER — POTASSIUM CHLORIDE CRYS ER 10 MEQ PO TBCR: EXTENDED_RELEASE_TABLET | ORAL | Status: DC

## 2011-12-20 NOTE — Telephone Encounter (Signed)
Spoke with pt. He states you can not get the k 10 meq OTC. I have called in rx for this and advised that he is only to take this med when he takes fluid pill. Pt verbalized understanding. States nothing further needed.

## 2011-12-20 NOTE — Progress Notes (Signed)
Patient ID: [name], male    DOB: 01/22/1941, 71 y.o.   MRN: [id number]  HPI 12/15/10- 81 yoM former smoker, followed for COPD complicated by hx RUL AdenoCa/ lobectomy 2005. Last here 11/01/10. Note reviewed. He still feels more short of breath than is comfortable- easily fatigued with exertion and doesn't understand why with good oxygen levels.  Went down to [employer] to dance - not any harder than here. He came back with a cough and was given a cough pill. Advair sample seemed better than spiriva, but is not covered by [place] and he has lost job. Feet still swell. Denies chest pain, palpitation.  PFT today- Severe emphysema. FEV1/FVC 0.39, DLCO 0.36, insignif resp to bronchodilator.  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 Still goes dancing regularly. Cardiologist told him heart was okay and that shortness of breath was due to his lungs. He is now using Asmanex 220, Foradil, as provided by the [place]. There has been no change in his ankle edema which is not dependent. He has no history of DVT. Dr Edwyna Shell did a six-month followup chest x-ray, now 8 years postoperatively after his right upper lobectomy for cancer. The patient wants a chest x-ray at least once a year by me for ongoing long-term surveillance. We discussed chest x-ray versus CT scan. Chest x-ray: 04/12/2011-stable right middle lobe nodule and postoperative scarring. Images were reviewed with him.  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 Treated with antibiotics and prednisone tapers. Has 3 more days on current taper. Head chest x-ray February 16 showing chronic right middle lobe scarring, pectus excavatum. His exercise was dancing and he hasn't been  able to dance because of shortness of breath. Blames lack of exercise and repeated prednisone tapers for 12 pound weight gain and admits he is eating more. We discussed steroid side effects. Wakes with malaise in the morning, weak, scant cough, nasal congestion. Denies sore throat, fever, purulent sputum, palpitation, chest pain. Can't afford pulmonary rehabilitation not covered by Medicare. Asks cheap alternative for Pramosone steroid cream for rectal itch.   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 Increased shortness of breath with exertion. Stays congested with cough and wheeze. He thinks weather is the problem. [facility] gave oxygen 2 L per minute. He has only used it for a total of about 10 minutes since he has had it. He has an oximeter. We reviewed indications for oxygen and assured him it would not make him more dependent. He notices leg edema but is not using his Lasix because he considers diuresis "a nuisance". CXR 04/12/11 reviewed with him IMPRESSION:  Stable nodular area consistent with postoperative scarring in the  right middle lobe. COPD.  Original Report Authenticated By: Juline Patch, M.D.   Review of Systems-see HPI Constitutional:   No-   weight loss, night sweats, fevers, chills, fatigue, lassitude. HEENT:   No-  headaches, difficulty swallowing, tooth/dental problems, sore throat,       No-  sneezing, itching, ear ache, nasal congestion, post nasal drip,  CV:  No-   chest pain, orthopnea, PND, no change in  swelling in lower extremities, no-dizziness, palpitations Resp: + shortness of breath with exertion or at rest.                + productive cough,  No non-productive cough,  No- coughing up of blood.              No-   change in color of mucus.  No- wheezing.   Skin: No-   rash or lesions. GI:  No-   heartburn, indigestion, abdominal pain, nausea, vomiting,    GU:  MS:  No-   joint pain or swelling.  Marland Kitchen  No- back pain. Neuro-     nothing unusual Psych:  No- change in mood or affect. No depression or anxiety.  No memory loss.    Objective:   Physical Exam General- Alert, Oriented, Affect-appropriate, Distress- none acute Skin- rash-none, lesions- none, excoriation- none Lymphadenopathy- none Head- atraumatic            Eyes- Gross vision intact, PERRLA, conjunctivae clear secretions            Ears- Hearing, canals-normal            Nose- Clear, no-Septal dev, mucus, polyps, erosion, perforation             Throat- Mallampati II , mucosa clear , drainage- none, tonsils- atrophic Neck- flexible , trachea midline, no stridor , thyroid nl, carotid no bruit Chest - symmetrical excursion , unlabored           Heart/CV- RRR rapid , no murmur , no gallop  , no rub, nl s1 s2                           - JVD- 2+ , edema- 2+, stasis changes- none, varices- none           Lung-  diminished clear to P&A, wheeze- none, no- cough , dullness-none, rub- none           Chest wall-  Abd-  Br/ Gen/ Rectal- Not done, not indicated Extrem- cyanosis- none, clubbing, none, atrophy- none, strength- nl, heavy legs, Homan's-Neg Neuro- grossly intact to observation

## 2011-12-20 NOTE — Patient Instructions (Signed)
Sample Arcapta   1 daily instead of Foradil. Go back to Foradil when you run out of the Arcapta sample. Continue Asmanex (pink steroid)  Take lasix/ furosemide diuretic once daily for now to reduce body water and see if that helps your breathing. With daily lasix you need to take potassium. Look for an otc potassium pill around 10 meq, such as Slow K. Your pharmacist can help with this.

## 2011-12-25 NOTE — Assessment & Plan Note (Signed)
Stable chest x-ray scarring with no evidence of recurrence.

## 2011-12-25 NOTE — Assessment & Plan Note (Signed)
Probably cor pulmonale Plan-take Lasix 20 mg daily. Add potassium , Low-dose OTC, 6-10 meq.Marland Kitchen

## 2011-12-25 NOTE — Assessment & Plan Note (Signed)
He still tries to go dancing in the evenings occasionally. We gave instructions on cleaning his nebulizer machine and use of oxygen. Plan-continue Asmanex. Instead of Foradil try Arcapta inhaler

## 2012-01-24 ENCOUNTER — Other Ambulatory Visit: Payer: Self-pay | Admitting: Internal Medicine

## 2012-03-08 ENCOUNTER — Telehealth: Payer: Self-pay | Admitting: Internal Medicine

## 2012-03-08 MED ORDER — TRAMADOL HCL (ER BIPHASIC) 100 MG PO CP24: 1.0000 | ORAL_CAPSULE | Freq: Four times a day (QID) | ORAL | Status: DC | PRN

## 2012-03-08 NOTE — Telephone Encounter (Addendum)
Per Dr. [name], Tramadol 100mg  #20 one every 6 hours prn for cough, can also use Delsym otc. Thanks.

## 2012-03-08 NOTE — Telephone Encounter (Signed)
Called spoke with patient, advised of CY's recs.  Pt verbalized his understanding.  rx sent to verified pharmacy.  Pt to call if his symptoms do not improve or worsen.

## 2012-03-08 NOTE — Telephone Encounter (Signed)
Called [facility] spoke with Tresa Endo and verified with her that CY does want pt to take the tramadol 100mg  every 6 hours > was verified on the hand-written response to the message given to CY.  Tresa Endo verbalized her understanding.  Nothing further needed.  Will sign off.

## 2012-03-08 NOTE — Telephone Encounter (Signed)
I spoke with pt and he c/o continuous dry cough x 1 month. He has seen his pcp twice and was given amoxicillin and prednisone--did not help. He has been taking mucinex, doing salt water gargles, honey, and using cough drops w/o relief. Pt made an appt to see Dr. [name] on Monday 03/11/12 at 10:45. Pt is requesting recs until then. Please advise Dr. [name] thanks  Allergies  Allergen Reactions  . Antihistamines, Diphenhydramine-Type   . Codeine   . Morphine   . Other     Surgical Tape  . Tiotropium Bromide Monohydrate     ? Prostate issues     [facility]

## 2012-03-11 ENCOUNTER — Encounter: Payer: Self-pay | Admitting: Internal Medicine

## 2012-03-11 ENCOUNTER — Ambulatory Visit (INDEPENDENT_AMBULATORY_CARE_PROVIDER_SITE_OTHER): Payer: Medicare Other | Admitting: Internal Medicine

## 2012-03-11 VITALS — BP 122/68 | HR 94 | Ht 66.0 in | Wt 156.0 lb

## 2012-03-11 DIAGNOSIS — J449 Chronic obstructive pulmonary disease, unspecified: Secondary | ICD-10-CM

## 2012-03-11 MED ORDER — METHYLPREDNISOLONE ACETATE 80 MG/ML IJ SUSP
80.0000 mg | Freq: Once | INTRAMUSCULAR | Status: AC
  Administered 2012-03-11: 80 mg via INTRAMUSCULAR

## 2012-03-11 MED ORDER — LEVALBUTEROL HCL 0.63 MG/3ML IN NEBU
0.6300 mg | INHALATION_SOLUTION | Freq: Once | RESPIRATORY_TRACT | Status: AC
  Administered 2012-03-11: 0.63 mg via RESPIRATORY_TRACT

## 2012-03-11 NOTE — Progress Notes (Signed)
Patient ID: SEVAN MCBROOM, male    DOB: 1941-04-06, 71 y.o.   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 Last here 11/01/10. Note reviewed. He still feels more short of breath than is comfortable- easily fatigued with exertion and doesn't understand why with good oxygen levels.  Went down to [employer] to dance - not any harder than here. He came back with a cough and was given a cough pill. Advair sample seemed better than spiriva, but is not covered by [place] and he has lost job. Feet still swell. Denies chest pain, palpitation.  PFT today- Severe emphysema. FEV1/FVC 0.39, DLCO 0.36, insignif resp to bronchodilator.  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 Still goes dancing regularly. Cardiologist told him heart was okay and that shortness of breath was due to his lungs. He is now using Asmanex 220, Foradil, as provided by the [place]. There has been no change in his ankle edema which is not dependent. He has no history of DVT. Dr Arlyce Dice did a six-month followup chest x-ray, now 8 years postoperatively after his right upper lobectomy for cancer. The patient wants a chest x-ray at least once a year by me for ongoing long-term surveillance. We discussed chest x-ray versus CT scan. Chest x-ray: 04/12/2011-stable right middle lobe nodule and postoperative scarring. Images were reviewed with him.  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 Treated with antibiotics and prednisone tapers. Has 3 more days on current taper. Head chest x-ray February 16 showing chronic right middle lobe scarring, pectus excavatum. His exercise was dancing and he hasn't been  able to dance because of shortness of breath. Blames lack of exercise and repeated prednisone tapers for 12 pound weight gain and admits he is eating more. We discussed steroid side effects. Wakes with malaise in the morning, weak, scant cough, nasal congestion. Denies sore throat, fever, purulent sputum, palpitation, chest pain. Can't afford pulmonary rehabilitation not covered by Medicare. Asks cheap alternative for Pramosone steroid cream for rectal itch.   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 Increased shortness of breath with exertion. Stays congested with cough and wheeze. He thinks weather is the problem. [facility] gave oxygen 2 L per minute. He has only used it for a total of about 10 minutes since he has had it. He has an oximeter. We reviewed indications for oxygen and assured him it would not make him more dependent. He notices leg edema but is not using his Lasix because he considers diuresis "a nuisance". CXR 04/12/11 reviewed with him IMPRESSION:  Stable nodular area consistent with postoperative scarring in the  right middle lobe. COPD.  Original Report Authenticated By: [name], M.D.    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 Cough has been more productive-  he blames "overwork in hot weather". His primary physician saw him July 27 and gave prednisone and antibiotic. Chest x-ray showed no pneumonia. He dislikes Delsym. Tramadol helped cough but made him dizzy. Nebulizer sometimes helps. Leg edema has been better and he has been off of Lasix  Review of Systems-see  HPI Constitutional:   +  weight loss, No-night sweats, fevers, chills, fatigue, lassitude. HEENT:   No-  headaches, difficulty swallowing, tooth/dental problems, sore throat,       No-  sneezing, itching, ear ache, nasal congestion, post nasal drip,  CV:  No-   chest pain, orthopnea, PND, +less swelling in lower extremities, no-dizziness, palpitations Resp: + shortness of breath with exertion or at rest.                + productive cough,  + non-productive cough,  No- coughing up of blood.              No-   change in color of mucus.  No- wheezing.   Skin: No-   rash or lesions. GI:  No-   heartburn, indigestion, abdominal pain, nausea, vomiting,  GU:  MS:  No-   joint pain or swelling.  Marland Kitchen  No- back pain. Neuro-     nothing unusual Psych:  No- change in mood or affect. No depression or anxiety.  No memory loss.    Objective:   Physical Exam General- Alert, Oriented, Affect-appropriate, Distress- none acute; looks thinner Skin- rash-none, lesions- none, excoriation- none Lymphadenopathy- none Head- atraumatic            Eyes- Gross vision intact, PERRLA, conjunctivae clear secretions            Ears- Hearing, canals-normal            Nose- Clear, no-Septal dev, mucus, polyps, erosion, perforation             Throat- Mallampati II , mucosa clear , drainage- none, tonsils- atrophic Neck- flexible , trachea midline, no stridor , thyroid nl, carotid no bruit Chest - symmetrical excursion , unlabored           Heart/CV- RRR + rapid , no murmur , no gallop  , no rub, nl s1 s2                           - JVD- 2+ , edema- + trace, stasis changes- none, varices- none           Lung-  diminished clear to P&A, wheeze- none, + persistent light dry cough , dullness-none, rub- none           Chest wall-  Abd-  Br/ Gen/ Rectal- Not done, not indicated Extrem- cyanosis- none, clubbing, none, atrophy- none, strength- nl, heavy legs,  Neuro- grossly intact to observation

## 2012-03-11 NOTE — Patient Instructions (Addendum)
Neb xop 0.63  Depo 80  CXR

## 2012-03-17 NOTE — Assessment & Plan Note (Signed)
Persistent cough, weight loss raise concern about recurrence of lung cancer despite his port a chest x-ray in late July negative. Plan-chest x-ray, nebulizer treatment with Xopenex, Depo-Medrol. Consider theophylline level although he is only taking 100 mg twice daily. Watch for dehydration.

## 2012-03-18 ENCOUNTER — Telehealth: Payer: Self-pay | Admitting: Internal Medicine

## 2012-03-18 NOTE — Telephone Encounter (Signed)
I spoke with Lupita Leash and she stated Selena Batten had called about pt xray that Dr. Kathrynn Running did. Last CXR was 04/2011 and this is in EP[facility]. Will send to Selena Batten to see if she was calling for anything further

## 2012-03-19 NOTE — Telephone Encounter (Signed)
[name], did you call pt?  Thanks.

## 2012-03-21 ENCOUNTER — Ambulatory Visit: Payer: Medicare Other | Admitting: Internal Medicine

## 2012-06-11 ENCOUNTER — Encounter: Payer: Self-pay | Admitting: Internal Medicine

## 2012-06-11 ENCOUNTER — Ambulatory Visit (INDEPENDENT_AMBULATORY_CARE_PROVIDER_SITE_OTHER): Payer: Medicare Other | Admitting: Internal Medicine

## 2012-06-11 VITALS — BP 122/70 | HR 96 | Ht 66.0 in | Wt 157.6 lb

## 2012-06-11 DIAGNOSIS — C3490 Malignant neoplasm of unspecified part of unspecified bronchus or lung: Secondary | ICD-10-CM

## 2012-06-11 DIAGNOSIS — J449 Chronic obstructive pulmonary disease, unspecified: Secondary | ICD-10-CM

## 2012-06-11 DIAGNOSIS — J4489 Other specified chronic obstructive pulmonary disease: Secondary | ICD-10-CM

## 2012-06-11 NOTE — Progress Notes (Signed)
Patient ID: [name], male    DOB: 1941-04-06, 71 y.o.   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 Last here 11/01/10. Note reviewed. He still feels more short of breath than is comfortable- easily fatigued with exertion and doesn't understand why with good oxygen levels.  Went down to [employer] to dance - not any harder than here. He came back with a cough and was given a cough pill. Advair sample seemed better than spiriva, but is not covered by [place] and he has lost job. Feet still swell. Denies chest pain, palpitation.  PFT today- Severe emphysema. FEV1/FVC 0.39, DLCO 0.36, insignif resp to bronchodilator.  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 Still goes dancing regularly. Cardiologist told him heart was okay and that shortness of breath was due to his lungs. He is now using Asmanex 220, Foradil, as provided by the [place]. There has been no change in his ankle edema which is not dependent. He has no history of DVT. Dr Arlyce Dice did a six-month followup chest x-ray, now 8 years postoperatively after his right upper lobectomy for cancer. The patient wants a chest x-ray at least once a year by me for ongoing long-term surveillance. We discussed chest x-ray versus CT scan. Chest x-ray: 04/12/2011-stable right middle lobe nodule and postoperative scarring. Images were reviewed with him.  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 Treated with antibiotics and prednisone tapers. Has 3 more days on current taper. Head chest x-ray February 16 showing chronic right middle lobe scarring, pectus excavatum. His exercise was dancing and he hasn't been  able to dance because of shortness of breath. Blames lack of exercise and repeated prednisone tapers for 12 pound weight gain and admits he is eating more. We discussed steroid side effects. Wakes with malaise in the morning, weak, scant cough, nasal congestion. Denies sore throat, fever, purulent sputum, palpitation, chest pain. Can't afford pulmonary rehabilitation not covered by Medicare. Asks cheap alternative for Pramosone steroid cream for rectal itch.   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 Increased shortness of breath with exertion. Stays congested with cough and wheeze. He thinks weather is the problem. [facility] gave oxygen 2 L per minute. He has only used it for a total of about 10 minutes since he has had it. He has an oximeter. We reviewed indications for oxygen and assured him it would not make him more dependent. He notices leg edema but is not using his Lasix because he considers diuresis "a nuisance". CXR 04/12/11 reviewed with him IMPRESSION:  Stable nodular area consistent with postoperative scarring in the  right middle lobe. COPD.  Original Report Authenticated By: [name], M.D.    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 Cough has been more productive-  he blames "overwork in hot weather". His primary physician saw him July 27 and gave prednisone and antibiotic. Chest x-ray showed no pneumonia. He dislikes Delsym. Tramadol helped cough but made him dizzy. Nebulizer sometimes helps. 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 Still having SOB with exertion-normally this doesnt happen; took breathing tx and breathing pills this morning.  CXR- 03/25/12- at Dr Broadus John- NAD.  COPD Assessment Test (CAT) score 7/ 40 Home oxygen from a [place] [employer] company at 2 L. He doesn't use it much. He says he sleeps up and down between sofa and bad. The noise of his oxygen machine bothers him so he tries to limit use. Dyspnea on exertion from the car into his office.  Review of Systems-see HPI Constitutional:   No-  weight loss, No-night sweats, fevers, chills, fatigue, lassitude. HEENT:   No-  headaches, difficulty swallowing, tooth/dental problems, sore throat,       No-  sneezing, itching, ear ache, nasal congestion, post nasal drip,  CV:  No-   chest pain, orthopnea, PND, +less swelling in lower extremities, no-dizziness, palpitations Resp: + shortness of breath with exertion or at rest.                + Little productive cough,  + non-productive cough,  No- coughing up of blood.              No-   change in color of mucus.  No- wheezing.   Skin: No-   rash or lesions. GI:  No-   heartburn, indigestion, abdominal pain, nausea, vomiting,  GU:  MS:  No-   joint pain or swelling.   Neuro-     nothing unusual Psych:  No- change in mood or affect. No depression or anxiety.  No memory loss.    Objective:   Physical Exam BP 122/70  Pulse 96  Ht 5\' 6"  (1.676 m)  Wt 157 lb 9.6 oz (71.487 kg)  BMI 25.44 kg/m2  SpO2 95% General- Alert, Oriented, Affect-appropriate, Distress- none acute;  Skin- rash-none, lesions- none, excoriation- none Lymphadenopathy- none Head- atraumatic            Eyes- Gross vision intact, PERRLA, conjunctivae clear secretions            Ears- Hearing, canals-normal            Nose- Clear, no-Septal dev, mucus, polyps, erosion, perforation              Throat- Mallampati II , mucosa clear , drainage- none, tonsils- atrophic Neck- flexible , trachea midline, no stridor , thyroid nl, carotid no bruit Chest - symmetrical excursion , unlabored           Heart/CV- RRR + rapid , no murmur , no gallop  , no rub, nl s1 s2                           - JVD- 1+ , edema- + trace, stasis changes- none, varices- none           Lung-  +diminished clear to P&A, wheeze- none, + persistent light dry cough , dullness-none, rub- none. +Some pursed lip breathing.           Chest wall-  Abd-  Br/ Gen/ Rectal- Not done, not indicated Extrem- cyanosis- none, clubbing, none, atrophy- none, strength- nl, heavy legs,  Neuro- grossly intact to observation

## 2012-06-11 NOTE — Patient Instructions (Addendum)
Please call as needed 

## 2012-06-19 NOTE — Assessment & Plan Note (Addendum)
No recurrence. Long-term surveillance.

## 2012-06-19 NOTE — Assessment & Plan Note (Signed)
Severe emphysema. Getting over a cold but otherwise no real change. I explained the therapeutic effect of oxygen during sleep and encouraged use as much as possible.

## 2012-08-29 ENCOUNTER — Other Ambulatory Visit: Payer: Self-pay | Admitting: Internal Medicine

## 2012-10-26 ENCOUNTER — Other Ambulatory Visit: Payer: Self-pay | Admitting: Internal Medicine

## 2012-10-29 ENCOUNTER — Other Ambulatory Visit: Payer: Self-pay | Admitting: Internal Medicine

## 2012-12-11 ENCOUNTER — Encounter: Payer: Self-pay | Admitting: Internal Medicine

## 2012-12-11 ENCOUNTER — Ambulatory Visit (INDEPENDENT_AMBULATORY_CARE_PROVIDER_SITE_OTHER): Payer: Medicare Other | Admitting: Internal Medicine

## 2012-12-11 ENCOUNTER — Other Ambulatory Visit (INDEPENDENT_AMBULATORY_CARE_PROVIDER_SITE_OTHER): Payer: Medicare Other

## 2012-12-11 ENCOUNTER — Ambulatory Visit (INDEPENDENT_AMBULATORY_CARE_PROVIDER_SITE_OTHER)
Admission: RE | Admit: 2012-12-11 | Discharge: 2012-12-11 | Disposition: A | Discharge status: Home / Self Care | Payer: Medicare Other | Source: Ambulatory Visit | Attending: Internal Medicine | Admitting: Internal Medicine

## 2012-12-11 VITALS — BP 120/72 | HR 95 | Ht 66.5 in | Wt 159.8 lb

## 2012-12-11 DIAGNOSIS — J449 Chronic obstructive pulmonary disease, unspecified: Secondary | ICD-10-CM

## 2012-12-11 DIAGNOSIS — R0602 Shortness of breath: Secondary | ICD-10-CM

## 2012-12-11 LAB — CBC WITH DIFFERENTIAL/PLATELET
Basophils Absolute: 0 10*3/uL (ref 0.0–0.1)
Eosinophils Absolute: 0.1 10*3/uL (ref 0.0–0.7)
Lymphocytes Relative: 21.4 % (ref 12.0–46.0)
MCHC: 34.3 g/dL (ref 30.0–36.0)
MCV: 85.5 fl (ref 78.0–100.0)
Monocytes Absolute: 0.4 10*3/uL (ref 0.1–1.0)
Neutrophils Relative %: 70.3 % (ref 43.0–77.0)
RDW: 13.3 % (ref 11.5–14.6)

## 2012-12-11 LAB — BASIC METABOLIC PANEL
BUN: 12 mg/dL (ref 6–23)
Chloride: 105 mEq/L (ref 96–112)
Creatinine, Ser: 1.1 mg/dL (ref 0.4–1.5)
GFR: 68.76 mL/min (ref >60.00)
Potassium: 4.2 mEq/L (ref 3.5–5.1)

## 2012-12-11 MED ORDER — BUDESONIDE 1 MG/2ML IN SUSP: RESPIRATORY_TRACT | Status: DC

## 2012-12-11 MED ORDER — TRAMADOL HCL (ER BIPHASIC) 100 MG PO CP24: 1.0000 | ORAL_CAPSULE | Freq: Four times a day (QID) | ORAL | Status: DC | PRN

## 2012-12-11 NOTE — Patient Instructions (Addendum)
Script for budesonide neb solution to use twice daily. Rinse your mouth after each treatment to avoid getting thrush. Try this for now instead of Asmanex.  Order- CXR dx COPD                      Lab- CBC, BMET, D-dimer   Script for tramadol for cough

## 2012-12-11 NOTE — Progress Notes (Signed)
Patient ID: [name], male    DOB: 1941-04-06, 72 y.o.   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 Last here 11/01/10. Note reviewed. He still feels more short of breath than is comfortable- easily fatigued with exertion and doesn't understand why with good oxygen levels.  Went down to [employer] to dance - not any harder than here. He came back with a cough and was given a cough pill. Advair sample seemed better than spiriva, but is not covered by [place] and he has lost job. Feet still swell. Denies chest pain, palpitation.  PFT today- Severe emphysema. FEV1/FVC 0.39, DLCO 0.36, insignif resp to bronchodilator.  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 Still goes dancing regularly. Cardiologist told him heart was okay and that shortness of breath was due to his lungs. He is now using Asmanex 220, Foradil, as provided by the [place]. There has been no change in his ankle edema which is not dependent. He has no history of DVT. Dr Arlyce Dice did a six-month followup chest x-ray, now 8 years postoperatively after his right upper lobectomy for cancer. The patient wants a chest x-ray at least once a year by me for ongoing long-term surveillance. We discussed chest x-ray versus CT scan. Chest x-ray: 04/12/2011-stable right middle lobe nodule and postoperative scarring. Images were reviewed with him.  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 Treated with antibiotics and prednisone tapers. Has 3 more days on current taper. Head chest x-ray February 16 showing chronic right middle lobe scarring, pectus excavatum. His exercise was dancing and he hasn't been  able to dance because of shortness of breath. Blames lack of exercise and repeated prednisone tapers for 12 pound weight gain and admits he is eating more. We discussed steroid side effects. Wakes with malaise in the morning, weak, scant cough, nasal congestion. Denies sore throat, fever, purulent sputum, palpitation, chest pain. Can't afford pulmonary rehabilitation not covered by Medicare. Asks cheap alternative for Pramosone steroid cream for rectal itch.   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 Increased shortness of breath with exertion. Stays congested with cough and wheeze. He thinks weather is the problem. [facility] gave oxygen 2 L per minute. He has only used it for a total of about 10 minutes since he has had it. He has an oximeter. We reviewed indications for oxygen and assured him it would not make him more dependent. He notices leg edema but is not using his Lasix because he considers diuresis "a nuisance". CXR 04/12/11 reviewed with him IMPRESSION:  Stable nodular area consistent with postoperative scarring in the  right middle lobe. COPD.  Original Report Authenticated By: [name], M.D.    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 Cough has been more productive-  he blames "overwork in hot weather". His primary physician saw him July 27 and gave prednisone and antibiotic. Chest x-ray showed no pneumonia. He dislikes Delsym. Tramadol helped cough but made him dizzy. Nebulizer sometimes helps. 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 Still having SOB with exertion-normally this doesnt happen; took breathing tx and breathing pills this morning.  CXR- 03/25/12- at Dr Broadus John- NAD.  COPD Assessment Test (CAT) score 7/ 40 Home oxygen from a [place] [employer] company at 2 L. He doesn't use it much. He says he sleeps up and down between sofa and bad. The noise of his oxygen machine bothers him so he tries to limit use. Dyspnea on exertion from the car into his office.  12/11/12- 71 yoM former smoker, followed for COPD/ Gold 3 complicated by hx RUL AdenoCa/ lobectomy      Accompanied today by Dr Samuel Bouche- friend Follows for : pt states having increase sob, wheezing,  with activity . pt states also has a croup sounding cough.,chest congestion. Was treated with Celebrex for prostatitis. Had increased shortness of breath x 3 weeks, so he quit Celebrex 10 days ago. Productive cough with clear mucus x1 week. Sore right chest on waking for the past several mornings but no tussive pain and no adenopathy. Legs have been tender and feeling more swollen. No history of DVT - self or family.  Does better with albuterol by nebulizer than with metered inhalers.   Review of Systems-see HPI Constitutional:   No-  weight loss, No-night sweats, fevers, chills, fatigue, lassitude. HEENT:   No-  headaches, difficulty swallowing, tooth/dental problems, sore throat,       No-  sneezing, itching, ear ache, nasal congestion, post nasal drip,  CV:  + atypical chest pain, no-orthopnea, PND, + swelling in lower extremities, no-dizziness, palpitations Resp: + shortness of breath with exertion or at rest.                + Little productive cough,  + non-productive cough,  No- coughing up of blood.              No-   change in color of mucus.  No- wheezing.   Skin: No-   rash or lesions. GI:  No-   heartburn,  indigestion, abdominal pain, nausea, vomiting,  GU:  MS:  No-   joint pain or swelling.   Neuro-     nothing unusual Psych:  No- change in mood or affect. No depression or anxiety.  No memory loss.    Objective:   Physical Exam BP 120/72  Pulse 95  Ht 5' 6.5" (1.689 m)  Wt 159 lb 12.8 oz (72.485 kg)  BMI 25.41 kg/m2  SpO2 93% General- Alert, Oriented, Affect-appropriate, Distress- none acute;  Skin- rash-none, lesions- none, excoriation- none Lymphadenopathy- none Head- atraumatic            Eyes- Gross vision intact, PERRLA, conjunctivae clear secretions            Ears- Hearing, canals-normal            Nose- Clear, no-Septal dev, mucus, polyps, erosion, perforation             Throat- Mallampati II , mucosa clear , drainage- none, tonsils- atrophic Neck- flexible , trachea midline, no stridor ,  thyroid nl, carotid no bruit Chest - symmetrical excursion , unlabored           Heart/CV- RRR + rapid , no murmur , no gallop  , no rub, nl s1 s2                           - JVD- none , edema- + 1-2, stasis changes- none, varices- none           Lung-  +raspy wheeze, unlabored dullness-none, rub- none.           Chest wall-  Abd- no HSM Br/ Gen/ Rectal- Not done, not indicated Extrem- cyanosis- none, clubbing, none, atrophy- none, strength- nl, heavy legs, - Homan's Neuro- grossly intact to observation

## 2012-12-12 ENCOUNTER — Telehealth: Payer: Self-pay | Admitting: Internal Medicine

## 2012-12-12 NOTE — Telephone Encounter (Signed)
Pt last seen 12-11-12 and was given an rx for budesonide 1mg . Pt states this is too expensive because this strength only comes in name brand and he states per his pharmacy it would be much cheaper for him to get budesonide 0.5mg /61ml because it comes in a generic. Please advise if ok to make this change. Pt is aware that we will call him if this change is not ok, otherwise he will check with the pharmacy later today to pick up meds. Please advise. Carron Curie, CMA Allergies  Allergen Reactions  . Antihistamines, Diphenhydramine-Type   . Codeine   . Morphine   . Other     Surgical Tape  . Tiotropium Bromide Monohydrate     ? Prostate issues

## 2012-12-13 ENCOUNTER — Telehealth: Payer: Self-pay | Admitting: Internal Medicine

## 2012-12-13 ENCOUNTER — Ambulatory Visit (INDEPENDENT_AMBULATORY_CARE_PROVIDER_SITE_OTHER)
Admission: RE | Admit: 2012-12-13 | Discharge: 2012-12-13 | Disposition: A | Discharge status: Home / Self Care | Payer: Medicare Other | Source: Ambulatory Visit

## 2012-12-13 DIAGNOSIS — R7989 Other specified abnormal findings of blood chemistry: Secondary | ICD-10-CM

## 2012-12-13 DIAGNOSIS — I2699 Other pulmonary embolism without acute cor pulmonale: Secondary | ICD-10-CM

## 2012-12-13 DIAGNOSIS — R791 Abnormal coagulation profile: Secondary | ICD-10-CM

## 2012-12-13 MED ORDER — IOHEXOL 350 MG/ML SOLN
80.0000 mL | Freq: Once | INTRAVENOUS | Status: AC | PRN
  Administered 2012-12-13: 80 mL via INTRAVENOUS

## 2012-12-13 MED ORDER — BUDESONIDE 0.5 MG/2ML IN SUSP: 0.5000 mg | Freq: Two times a day (BID) | RESPIRATORY_TRACT | Status: DC

## 2012-12-13 NOTE — Progress Notes (Signed)
Quick Note:  Pt aware of results and would like about 1 hour to discuss with retired Pediatrician friend; will call back and ask for me so I can either order CT angio or let CY know that patient did not want CT angio. Will created encounter for order if patient chooses this option. ______

## 2012-12-13 NOTE — Telephone Encounter (Signed)
Ok to change Rx to budesonide (generic pulmicort) 0.5 mg/ 2 ml neb, # 60, ref prn, 1 twice daily.

## 2012-12-13 NOTE — Telephone Encounter (Signed)
Spoke with patient  Patient aware new Rx has been sent to verified pharmacy: [facility] [address] Verbalized understanding and nothing further needed at this time

## 2012-12-13 NOTE — Telephone Encounter (Signed)
Pt called to let me know that he did want to go ahead and get CT angio ordered and set up per D-dimer results form CY. Order has been placed. [facility] to call patient with date/time and location.

## 2012-12-13 NOTE — Progress Notes (Signed)
Quick Note:  Pt aware of results. ______ 

## 2012-12-16 ENCOUNTER — Telehealth: Payer: Self-pay | Admitting: Internal Medicine

## 2012-12-16 DIAGNOSIS — R918 Other nonspecific abnormal finding of lung field: Secondary | ICD-10-CM

## 2012-12-16 DIAGNOSIS — J449 Chronic obstructive pulmonary disease, unspecified: Secondary | ICD-10-CM

## 2012-12-16 DIAGNOSIS — R911 Solitary pulmonary nodule: Secondary | ICD-10-CM

## 2012-12-16 NOTE — Telephone Encounter (Signed)
CY - please look into other alternatives for PET scan for this patient as his insurance will not cover it. Thanks.

## 2012-12-16 NOTE — Telephone Encounter (Signed)
Pt calling again in ref to previous msg says he's pretty anxious to find out about the blood clot.Raylene Everts

## 2012-12-16 NOTE — Telephone Encounter (Signed)
Spoke to pt. He is aware of his CT results. I will order PET scan. Nothing further is needed at this time. All of the pt's questions were answered.  While trying to order the PET, his insurance will not cover this. CY is aware. Per CY - tell the pt that he will research another way to this done.  Pt is aware.

## 2012-12-19 ENCOUNTER — Other Ambulatory Visit: Payer: Self-pay | Admitting: Emergency Medicine

## 2012-12-19 DIAGNOSIS — R911 Solitary pulmonary nodule: Secondary | ICD-10-CM

## 2012-12-20 ENCOUNTER — Telehealth: Payer: Self-pay | Admitting: Internal Medicine

## 2012-12-20 NOTE — Telephone Encounter (Signed)
I discussed CT showing suspicious nodule in each lung. I had asked Dr Delton Coombes about technical feasibility of guided bronchoscopic bx, because they are too central in emphysematous lung to consider needle. Mr [name] insurance won't cover PET now, intended to determine activity and best target for bx. He has contacted his [facility] physician,  Dr Anise Salvo, in [place] She is contacting us for his records, then will see if th [facility] would cover the PET. I raised possibility she could get all of his w/u through the [place]. [name] will follow through with her, then decide whether to keep appointment set next week with Dr Delton Coombes for office consult on guided bronchoscopy.

## 2012-12-20 NOTE — Telephone Encounter (Signed)
Discussed w pt

## 2012-12-21 NOTE — Assessment & Plan Note (Signed)
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 Need to exclude pulmonary embolism. Plan-chest x-ray, D. Dimer. If d-dimer is elevated, then CT chest

## 2012-12-24 ENCOUNTER — Inpatient Hospital Stay (HOSPITAL_COMMUNITY): Admission: RE | Admit: 2012-12-24 | Payer: Medicare Other | Source: Ambulatory Visit

## 2012-12-24 ENCOUNTER — Institutional Professional Consult (permissible substitution): Payer: Medicare Other | Admitting: Emergency Medicine

## 2012-12-24 ENCOUNTER — Telehealth: Payer: Self-pay | Admitting: Internal Medicine

## 2012-12-24 NOTE — Telephone Encounter (Signed)
Please call the pt to see if he has spoken to anyone at the [place] about a PET scan, and whether he still wants to keep his appointment with me. Thanks

## 2012-12-24 NOTE — Telephone Encounter (Signed)
Looks like this an EBUS or ENB so I will forward to [place]. Carron Curie, CMA

## 2012-12-24 NOTE — Telephone Encounter (Signed)
Spoke with patient- Patient will have PET at [facility] this has been approved, scan will be in 2-3 weeks Patient states he has already spoken with Almyra Free about this and she will forwarding pts information- prev. Scans etc to [facility] MDs I have given patient our fax/telephone number so that he can call us to discuss next steps and records can be sent to our office from [place] once PET is done Nothing further at this time.

## 2012-12-24 NOTE — Telephone Encounter (Signed)
Thank you :)

## 2012-12-25 ENCOUNTER — Telehealth: Payer: Self-pay

## 2012-12-25 NOTE — Telephone Encounter (Signed)
ROI received From [facility] [place], CD Of CT (12/11/12) Mailed out to  [facility] [address] [place] [address]  Attn: Molli Hazard 12/25/2012/KM

## 2012-12-26 NOTE — Telephone Encounter (Signed)
Yes i did now enb has been cancelled Tobe Sos

## 2012-12-26 NOTE — Telephone Encounter (Signed)
I spoke with Dondra Spry at pre-op at [facility] and she states she has not spoken to Monon yet but she just wanted to let her know that the pt cancelled the procedure. Almyra Free do you need anything else with this patient or can we close the note? Thanks. Carron Curie, CMA

## 2012-12-27 ENCOUNTER — Ambulatory Visit: Admit: 2012-12-27 | Payer: Self-pay | Admitting: Emergency Medicine

## 2012-12-27 ENCOUNTER — Institutional Professional Consult (permissible substitution): Payer: Medicare Other | Admitting: Emergency Medicine

## 2012-12-27 SURGERY — BRONCHOSCOPY, WITH EBUS
Anesthesia: General

## 2013-01-15 ENCOUNTER — Telehealth: Payer: Self-pay | Admitting: Internal Medicine

## 2013-01-15 NOTE — Telephone Encounter (Signed)
Dr [name] w/ [facility] called me earlier to say that she had submitted request for PET. The VA radiologist apparently told her that, since PET can't distinguish infection from other active process- eg cancer, the radiologist would not approve a PET through [place] until patient treated for infection, then several weeks later, a f/u CT remained unchanged. I called Mr [name] to discuss this. He had already heard from Dr [name]. He is appropriately uncomfortable about more waiting. He does not feel he has any signs or symptoms of infection. He is going to provide [place] with records of antibiotic treatment by Dr Kathrynn Running last winter. Apparently Dr [name] is going to discuss again with radiology.  I suggested that if [facility] won't do PET, we go forward with consideration of guided bronchoscopy by Dr Delton Coombes, without a PET for now.  I can't completely rule out an atypical infection, such as MAIC, which would be relatively asymptomatic. The densities on CT may not all be the same process.

## 2013-01-15 NOTE — Telephone Encounter (Signed)
Spoke with pt and he reports cancelling biopsy with Dr Delton Coombes because he thought The [facility] would ok to do the PET scan.  Now the VA is telling pt that they want to put him on 4 weeks of medication (Pt not sure what kind this is yet) and then re evaluate need for PET scan.   A nurse at the [facility] is telling the pt that Dr [name] and Dr Cruzita Lederer at the [facility] are supposed to call the pt to discuss.  Pt would like to speak with Dr [name] regarding.

## 2013-01-16 ENCOUNTER — Telehealth: Payer: Self-pay | Admitting: Internal Medicine

## 2013-01-16 MED ORDER — CLARITHROMYCIN 500 MG PO TABS: ORAL_TABLET | ORAL | Status: DC

## 2013-01-16 NOTE — Telephone Encounter (Signed)
Will forward to CY ---pt wanted to speak with CY about this message.    Last ov--12/11/2012 Next ov--04/02/2013   Allergies  Allergen Reactions  . Tiotropium Bromide Monohydrate Other (See Comments)    Caused prostate issues, could not void.    Marland Kitchen Antihistamines, Diphenhydramine-Type Other (See Comments)    Keeps patient awake  . Codeine Itching    Nightmares, feels itchy inside-per patient  . Morphine Other (See Comments)    Unknown, possible itchy feelings inside, nightmares  . Other Itching    Surgical Tape

## 2013-01-16 NOTE — Telephone Encounter (Signed)
Pt wants Dr [name] to call dr. Leeroy Bock (radiologist) in [place] @ [phone] or [phone]. Pt states that this dr wants pt to take abx for 2 wks then have a CT 4 wks after taking abx. "Then if nodules are still present she will do PET". Pt is not satisfied with this. Also wants CY to know that he was prescribed prednisone 20mg  tabs on 08-16-12 as well as Bactrim 160mg  by dr Kathrynn Running (for COPD exacerbation). Then due to another exacerbation pt saw dr Kathrynn Running on 08-22-12 and was prescribed levaquin 500mg  and advised to continue prednisone. (Pt will speak to nurse but insisted that I take this entire msg / info before anyone calls him. He "prefers" to speak to dr young; however. Hazel Sams

## 2013-01-16 NOTE — Telephone Encounter (Signed)
He was treated with sulfa drug last winter. He came to me in May with very recent increase in cough, clear sputum, sore legs, dyspnea. We got CT then looking for PE. The lung nodules were incidental- not seen on 2012 CT or subsequent CXRs.  The [facility] referral radiologist in [place] won't approve PET unless lesions persist through 2 weeks of antibiotics, then 4 weeks wait until a f/u CT.  I have reviewed the images since the 2012 CT. Spoke again informally with Dr Delton Coombes who is willing to see patient to discuss guided bronchoscopy without PET, if we choose. I suggested we go ahead and start Biaxin x 2 weeks, which could address an atypical. 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 He is very uncomfortable about prolonged waiting.  Plan- I will discuss with the [facility] radiologist tomorrow, meanwhile send Rx for Biaxin x 2 weeks.

## 2013-01-17 ENCOUNTER — Telehealth: Payer: Self-pay | Admitting: Internal Medicine

## 2013-01-17 NOTE — Telephone Encounter (Signed)
Pt returned call.  He stated he did pick up Biaxin rx, but would still like to speak to nurse. Leanora Ivanoff

## 2013-01-17 NOTE — Telephone Encounter (Signed)
Per dr. [name] order the PET scan. DX lung cancer  I have done so order went through to [facility]. Pt aware of this. He gave me a # for them to call per his insurance [phone] and he would like to know about how much this will cost him. Please advise PCC's thanks

## 2013-01-17 NOTE — Telephone Encounter (Signed)
I have tried calling this number, getting voice mail that imaging is not able to take my call now.

## 2013-01-17 NOTE — Telephone Encounter (Signed)
Per phone note 12/16/12: [name], CMA at 12/16/2012 5:14 PM   Status: Signed            Spoke to pt.  He is aware of his CT results.  I will order PET scan.  Nothing further is needed at this time.  All of the pt's questions were answered.  While trying to order the PET, his insurance will not cover this.  CY is aware.  Per CY - tell the pt that he will research another way to this done  ----  Per pt he spoke with his insurance and they show we never tried to order a PET scan nor tried to get one preauthorized per pt. Pt is upset bc he has been busting his butt for 1 month trying to get everything straight with the [facility] also trying to get them to order one as well when the whole time (per pt) he could have gotten it done her and through his insurance). He stated he is fine with doing what Dr. [name] stated to do and proceed with the ABX then CT scan but would like Dr. [name] to know he can have the PET scan. Please advise Dr. [name] thanks

## 2013-01-17 NOTE — Telephone Encounter (Signed)
lmomtcb x1 for pt --which pharmacy Per Florentina Addison:  biaxin 500 mg BID x 2 weeks

## 2013-01-17 NOTE — Telephone Encounter (Signed)
I spoke with pt. He stated [facility] needed [facility]. Nothing further was needed

## 2013-01-17 NOTE — Telephone Encounter (Signed)
PET scan 01/28/13@11 :45am arrival pt is aware [name]

## 2013-01-17 NOTE — Telephone Encounter (Signed)
Looks like dr. [name] already called RX in for pt after further review in chart.  When pt calls back will just confirm he did pick up RX.

## 2013-01-28 ENCOUNTER — Encounter (HOSPITAL_COMMUNITY)
Admission: RE | Admit: 2013-01-28 | Discharge: 2013-01-28 | Disposition: A | Discharge status: Home / Self Care | Payer: Medicare Other | Source: Ambulatory Visit | Attending: Internal Medicine | Admitting: Internal Medicine

## 2013-01-28 ENCOUNTER — Encounter (HOSPITAL_COMMUNITY): Payer: Self-pay

## 2013-01-28 ENCOUNTER — Telehealth: Payer: Self-pay | Admitting: Internal Medicine

## 2013-01-28 DIAGNOSIS — J984 Other disorders of lung: Secondary | ICD-10-CM | POA: Insufficient documentation

## 2013-01-28 DIAGNOSIS — N400 Enlarged prostate without lower urinary tract symptoms: Secondary | ICD-10-CM | POA: Insufficient documentation

## 2013-01-28 LAB — GLUCOSE, CAPILLARY: Glucose-Capillary: 91 mg/dL (ref 70–99)

## 2013-01-28 MED ORDER — FLUDEOXYGLUCOSE F - 18 (FDG) INJECTION
20.1000 | Freq: Once | INTRAVENOUS | Status: AC | PRN
  Administered 2013-01-28: 20.1 via INTRAVENOUS

## 2013-01-28 NOTE — Telephone Encounter (Signed)
I called him report of PET showing less conspicuous nodules, non-malignant level uptake. Radiologist recognizes ambiguity and recommended f/u CT in 3 months. He agrees. He is also concerned [facility] brought him in for a short exercise oximetry- didn't desaturate. We said he might not need O2, but we would bring him here for a 6 MWT to double check.

## 2013-01-29 ENCOUNTER — Other Ambulatory Visit: Payer: Self-pay | Admitting: Internal Medicine

## 2013-01-29 DIAGNOSIS — R918 Other nonspecific abnormal finding of lung field: Secondary | ICD-10-CM

## 2013-01-29 DIAGNOSIS — J449 Chronic obstructive pulmonary disease, unspecified: Secondary | ICD-10-CM

## 2013-01-29 NOTE — Progress Notes (Signed)
Quick Note:  Pt aware of results and CT Chest(Oct 2014) ordered as well as test appt made. ______

## 2013-01-30 ENCOUNTER — Ambulatory Visit (INDEPENDENT_AMBULATORY_CARE_PROVIDER_SITE_OTHER): Payer: Medicare Other | Admitting: Internal Medicine

## 2013-01-30 DIAGNOSIS — J4489 Other specified chronic obstructive pulmonary disease: Secondary | ICD-10-CM

## 2013-01-30 DIAGNOSIS — J449 Chronic obstructive pulmonary disease, unspecified: Secondary | ICD-10-CM

## 2013-02-03 ENCOUNTER — Other Ambulatory Visit: Payer: Self-pay | Admitting: Dermatology

## 2013-02-13 ENCOUNTER — Other Ambulatory Visit: Payer: Self-pay | Admitting: Internal Medicine

## 2013-02-16 NOTE — Progress Notes (Signed)
01/30/13- documentation for 6 minute walk test

## 2013-03-05 ENCOUNTER — Other Ambulatory Visit: Payer: Self-pay | Admitting: Internal Medicine

## 2013-03-17 IMAGING — CR DG CHEST 2V
2 series · 2 of 2 positions shown · non-contrast
Comparison: Chest 09/26/2010

CLINICAL DATA: Short of breath for 2 days.  Cough.  History of COPD
and lung cancer.  Right upper lobectomy.

CHEST - 2 VIEW

[w chest pa]
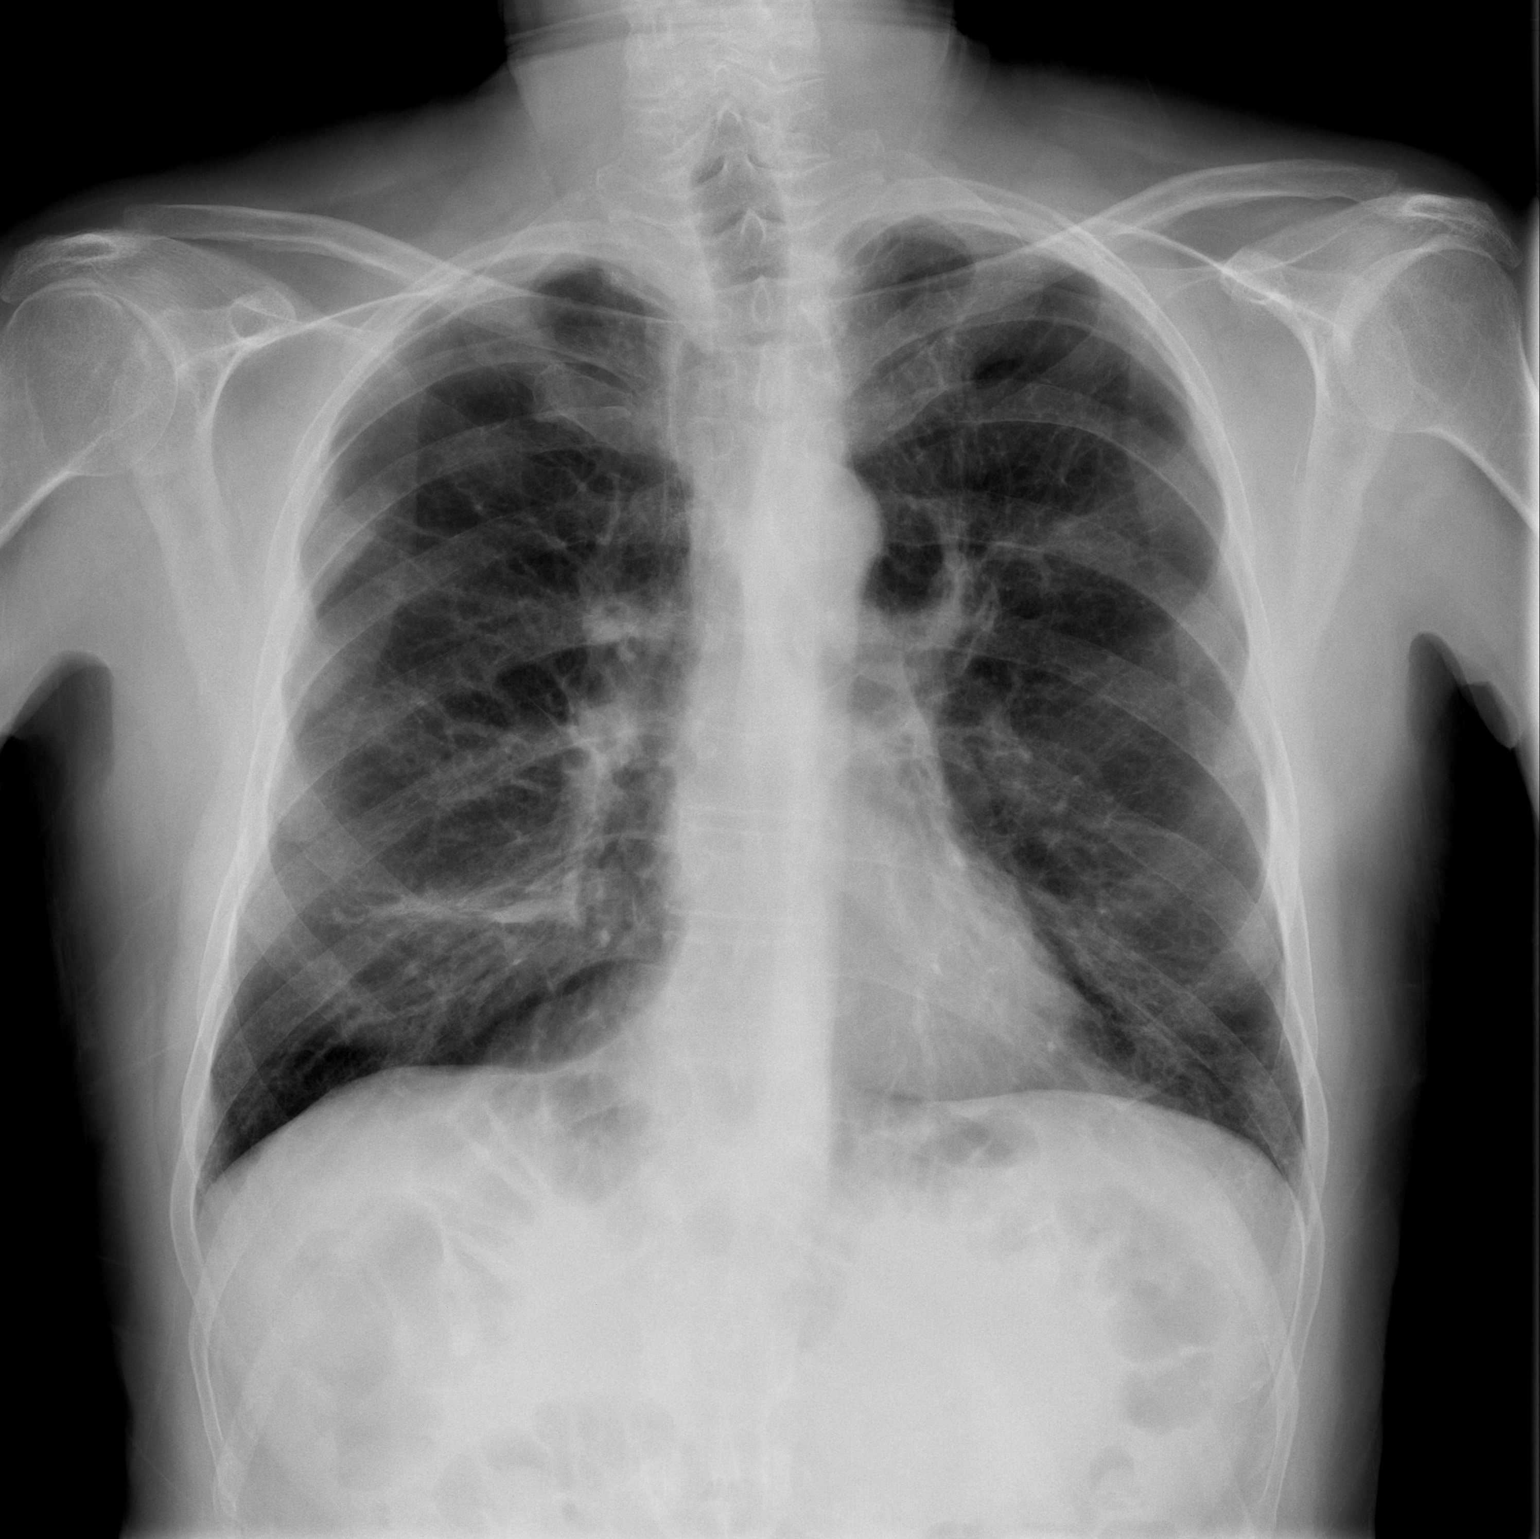

[w chest lat]
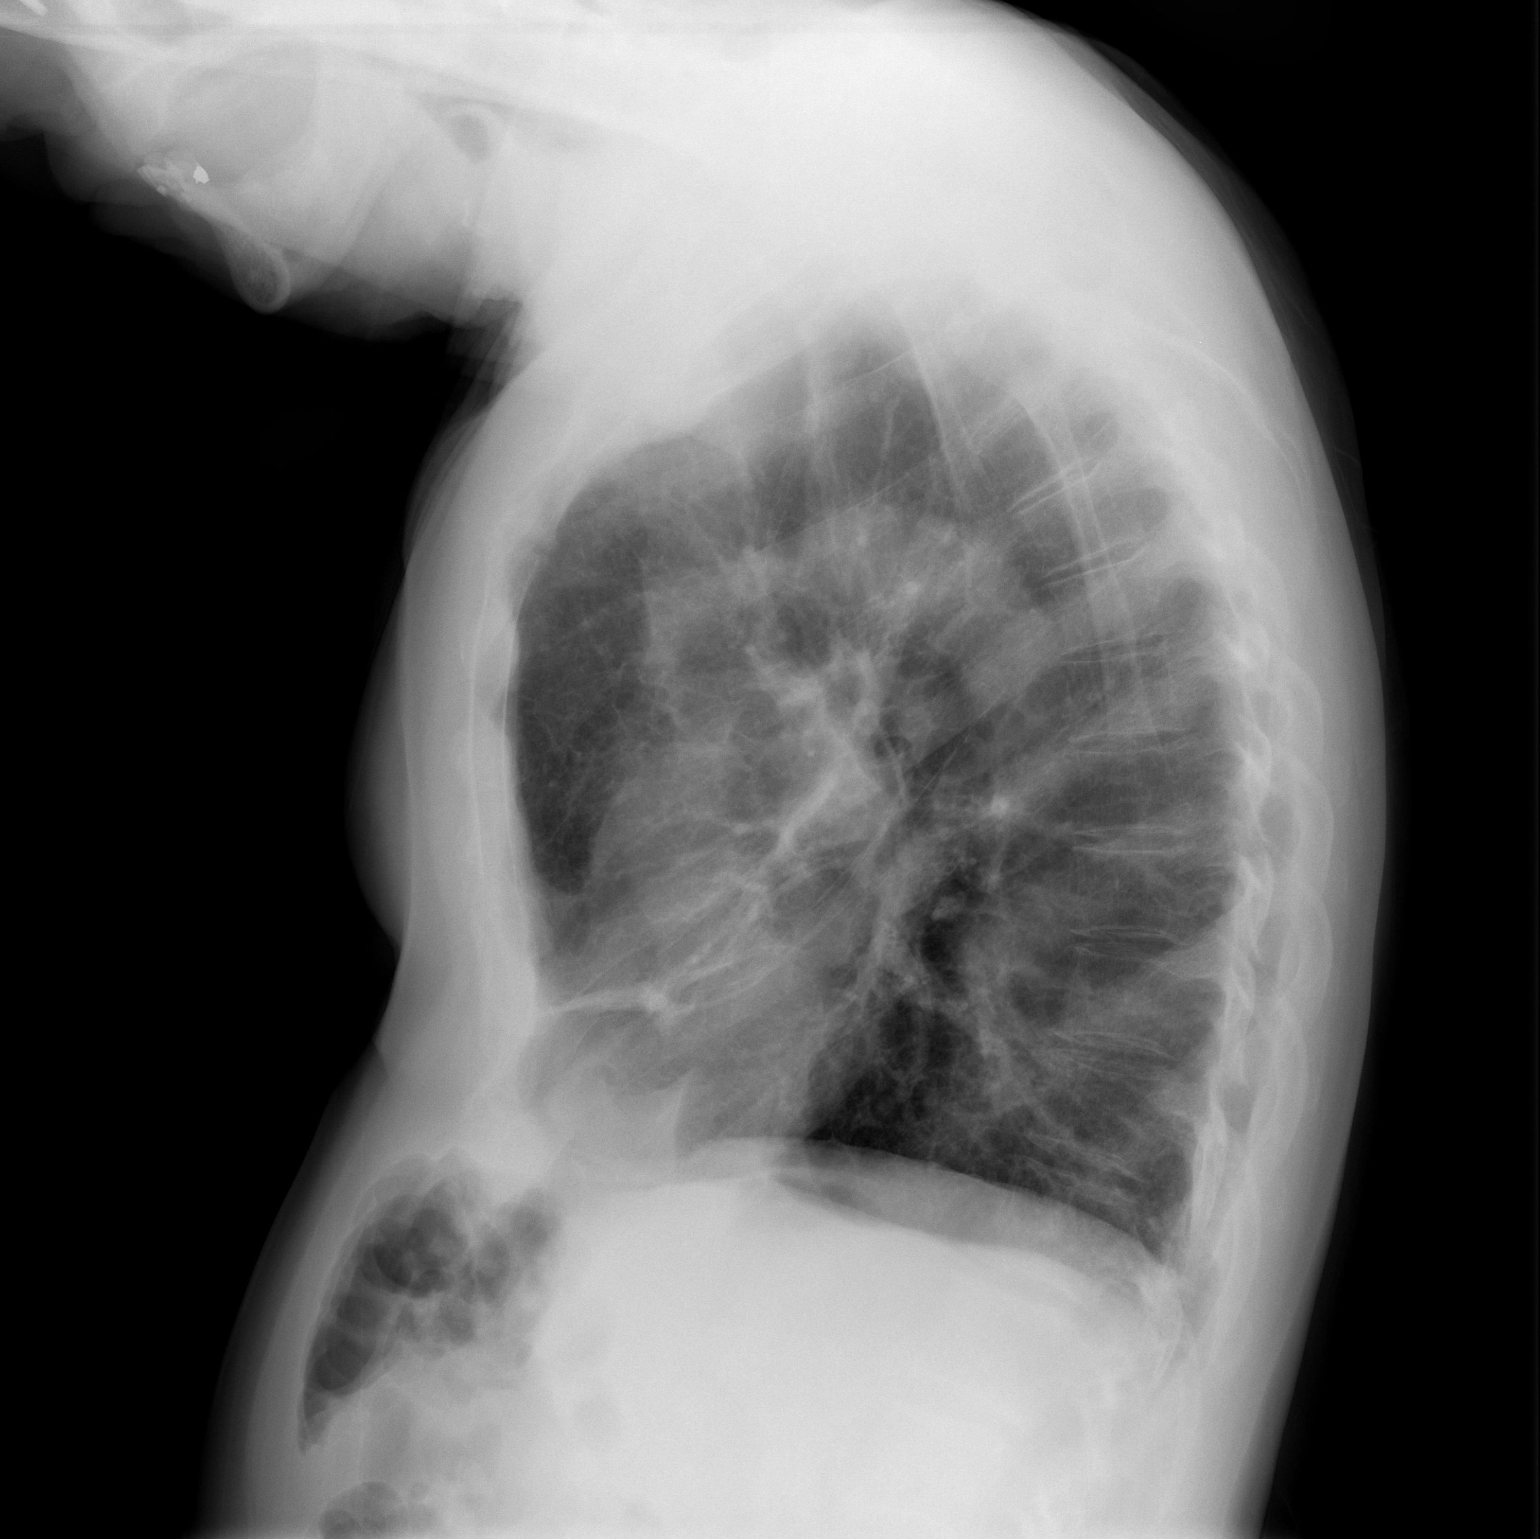

[2 of 2 positions shown; findings below may reference images not displayed]

FINDINGS: Normal heart size and pulmonary vascularity.  Linear
scarring in the right midlung is stable since the prior study and
probably related to postoperative change.  Emphysematous changes
suggested in the lungs.  No focal airspace consolidation.  No
blunting of costophrenic angles.
IMPRESSION: Stable appearance of the chest.  Emphysematous changes and
postoperative changes.  No evidence of active pulmonary disease.

## 2013-04-02 ENCOUNTER — Ambulatory Visit (INDEPENDENT_AMBULATORY_CARE_PROVIDER_SITE_OTHER): Payer: Medicare Other | Admitting: Internal Medicine

## 2013-04-02 ENCOUNTER — Encounter: Payer: Self-pay | Admitting: Internal Medicine

## 2013-04-02 VITALS — BP 120/72 | HR 76 | Ht 66.5 in | Wt 157.4 lb

## 2013-04-02 DIAGNOSIS — J984 Other disorders of lung: Secondary | ICD-10-CM

## 2013-04-02 DIAGNOSIS — J449 Chronic obstructive pulmonary disease, unspecified: Secondary | ICD-10-CM

## 2013-04-02 MED ORDER — BUDESONIDE 0.25 MG/2ML IN SUSP: RESPIRATORY_TRACT | Status: DC

## 2013-04-02 NOTE — Patient Instructions (Addendum)
Flu vax if ok with your insurance  Keep appointment for chest CT in October as planned

## 2013-04-02 NOTE — Progress Notes (Signed)
Patient ID: [name], male    DOB: 1941-04-06, 72 y.o.   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 Last here 11/01/10. Note reviewed. He still feels more short of breath than is comfortable- easily fatigued with exertion and doesn't understand why with good oxygen levels.  Went down to [employer] to dance - not any harder than here. He came back with a cough and was given a cough pill. Advair sample seemed better than spiriva, but is not covered by [place] and he has lost job. Feet still swell. Denies chest pain, palpitation.  PFT today- Severe emphysema. FEV1/FVC 0.39, DLCO 0.36, insignif resp to bronchodilator.  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 Still goes dancing regularly. Cardiologist told him heart was okay and that shortness of breath was due to his lungs. He is now using Asmanex 220, Foradil, as provided by the [place]. There has been no change in his ankle edema which is not dependent. He has no history of DVT. Dr Arlyce Dice did a six-month followup chest x-ray, now 8 years postoperatively after his right upper lobectomy for cancer. The patient wants a chest x-ray at least once a year by me for ongoing long-term surveillance. We discussed chest x-ray versus CT scan. Chest x-ray: 04/12/2011-stable right middle lobe nodule and postoperative scarring. Images were reviewed with him.  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 Treated with antibiotics and prednisone tapers. Has 3 more days on current taper. Head chest x-ray February 16 showing chronic right middle lobe scarring, pectus excavatum. His exercise was dancing and he hasn't been  able to dance because of shortness of breath. Blames lack of exercise and repeated prednisone tapers for 12 pound weight gain and admits he is eating more. We discussed steroid side effects. Wakes with malaise in the morning, weak, scant cough, nasal congestion. Denies sore throat, fever, purulent sputum, palpitation, chest pain. Can't afford pulmonary rehabilitation not covered by Medicare. Asks cheap alternative for Pramosone steroid cream for rectal itch.   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 Increased shortness of breath with exertion. Stays congested with cough and wheeze. He thinks weather is the problem. [facility] gave oxygen 2 L per minute. He has only used it for a total of about 10 minutes since he has had it. He has an oximeter. We reviewed indications for oxygen and assured him it would not make him more dependent. He notices leg edema but is not using his Lasix because he considers diuresis "a nuisance". CXR 04/12/11 reviewed with him IMPRESSION:  Stable nodular area consistent with postoperative scarring in the  right middle lobe. COPD.  Original Report Authenticated By: [name], M.D.    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 Cough has been more productive-  he blames "overwork in hot weather". His primary physician saw him July 27 and gave prednisone and antibiotic. Chest x-ray showed no pneumonia. He dislikes Delsym. Tramadol helped cough but made him dizzy. Nebulizer sometimes helps. 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 Still having SOB with exertion-normally this doesnt happen; took breathing tx and breathing pills this morning.  CXR- 03/25/12- at Dr Broadus John- NAD.  COPD Assessment Test (CAT) score 7/ 40 Home oxygen from a [place] DME company at 2 L. He doesn't use it much. He says he sleeps up and down between sofa and bad. The noise of his oxygen machine bothers him so he tries to limit use. Dyspnea on exertion from the car into his office.  12/11/12- 71 yoM former smoker, followed for COPD/ Gold 3 complicated by hx RUL AdenoCa/ lobectomy      Accompanied today by Dr Samuel Bouche- friend Follows for : pt states having increase sob, wheezing,  with activity . pt states also has a croup sounding cough.,chest congestion. Was treated with Celebrex for prostatitis. Had increased shortness of breath x 3 weeks, so he quit Celebrex 10 days ago. Productive cough with clear mucus x1 week. Sore right chest on waking for the past several mornings but no tussive pain and no adenopathy. Legs have been tender and feeling more swollen. No history of DVT - self or family.  Does better with albuterol by nebulizer than with metered inhalers.  04/02/13- 71 yoM former smoker, followed for COPD/ Gold 3, lung nodules, complicated by hx RUL AdenoCa/ lobectomy    PET scan was in nonmalignant range. He realizes nodules may be very slow-growing adenocarcinoma  and will keep appointment October 13 for followup chest CT    [facility]. He has wanted to keep home oxygen but did not desaturate enough on a limited walk test at the [place]. He did qualify on repeat and has oxygen at 2 L used as needed. Some productive cough. He feels well and can dance long enough to complete one song but not two. CT chest 12/17/12 IMPRESSION:  1. No evidence for  acute pulmonary embolus.  2. Suspicious, bilateral upper lobe nodules. Cannot rule out  small pulmonary neoplasms. Advise further evaluation with PET CT.  Original Report Authenticated By: Signa Kell, M.D.  PET 12/30/12 IMPRESSION:  1. Bilateral upper lobe nodular densities appear less conspicuous  on today's study. There is no malignant range FDG uptake  associated these structures which are favored to be the sequela of  inflammation or infection. However, because low grade pulmonary  adenocarcinoma is may exhibit low level FDG uptake I would advise  follow-up imaging to confirm continued resolution/ stability. The  next follow-up examination should be obtained in 3 months. At this  time a diagnostic CT of the chest should be obtained.  Original Report Authenticated By: Signa Kell, M.D.    Review of Systems-see HPI Constitutional:   No-  weight loss, No-night sweats, fevers, chills, fatigue, lassitude. HEENT:   No-  headaches, difficulty swallowing, tooth/dental problems, sore throat,       No-  sneezing, itching, ear ache, nasal congestion, post nasal drip,  CV:  No- chest pain, no-orthopnea, PND, + swelling in lower extremities, no-dizziness, palpitations Resp: +  shortness of breath with exertion or at rest.                + Little productive cough,  + non-productive cough,  No- coughing up of blood.              No-   change in color of mucus.  No- wheezing.   Skin: No-   rash or lesions. GI:  No-   heartburn, indigestion, abdominal pain, nausea, vomiting,  GU:  MS:  No-   joint pain or swelling.   Neuro-     nothing unusual Psych:  No- change in mood or affect. No depression or anxiety.  No memory loss.    Objective:   Physical Exam  General- Alert, Oriented, Affect-appropriate, Distress- none acute; looks well Skin- rash-none, lesions- none, excoriation- none Lymphadenopathy- none Head- atraumatic            Eyes- Gross vision intact, PERRLA, conjunctivae clear  secretions            Ears- Hearing, canals-normal            Nose- Clear, no-Septal dev, mucus, polyps, erosion, perforation             Throat- Mallampati II , mucosa clear , drainage- none, tonsils- atrophic Neck- flexible , trachea midline, no stridor , thyroid nl, carotid no bruit Chest - symmetrical excursion , unlabored           Heart/CV- RRR + rapid , no murmur , no gallop  , no rub, nl s1 s2                           - JVD- none , edema- trace, stasis changes- none, varices- none           Lung-  Distant/clear, unlabored dullness-none, rub- none.           Chest wall-  Abd- no HSM Br/ Gen/ Rectal- Not done, not indicated Extrem- cyanosis- none, clubbing, none, atrophy- none, strength- nl, heavy legs, - Homan's Neuro- grossly intact to observation

## 2013-04-09 NOTE — Assessment & Plan Note (Signed)
Long-term surveillance given prior history of slow-growing adenocarcinoma. Followup chest CT pending October 13.

## 2013-04-09 NOTE — Assessment & Plan Note (Signed)
Borderline oxygen dependent but doing better currently. He has lost peripheral edema. Plan-flu vaccine

## 2013-04-17 DIAGNOSIS — R918 Other nonspecific abnormal finding of lung field: Secondary | ICD-10-CM | POA: Insufficient documentation

## 2013-05-12 ENCOUNTER — Ambulatory Visit (INDEPENDENT_AMBULATORY_CARE_PROVIDER_SITE_OTHER)
Admission: RE | Admit: 2013-05-12 | Discharge: 2013-05-12 | Disposition: A | Discharge status: Home / Self Care | Payer: Medicare Other | Source: Ambulatory Visit | Attending: Internal Medicine | Admitting: Internal Medicine

## 2013-05-12 DIAGNOSIS — R918 Other nonspecific abnormal finding of lung field: Secondary | ICD-10-CM

## 2013-05-13 ENCOUNTER — Telehealth: Payer: Self-pay | Admitting: Internal Medicine

## 2013-05-13 NOTE — Progress Notes (Signed)
Quick Note:  Advised pt of CT results per CY. Pt verbalized understanding and will keep f/u appt ______

## 2013-05-19 NOTE — Telephone Encounter (Signed)
Pt has already been made aware of results. Will sign off message

## 2013-07-21 ENCOUNTER — Other Ambulatory Visit: Payer: Self-pay | Admitting: Internal Medicine

## 2013-07-29 ENCOUNTER — Ambulatory Visit (INDEPENDENT_AMBULATORY_CARE_PROVIDER_SITE_OTHER)
Admission: RE | Admit: 2013-07-29 | Discharge: 2013-07-29 | Disposition: A | Discharge status: Home / Self Care | Payer: Medicare Other | Source: Ambulatory Visit | Attending: Internal Medicine | Admitting: Internal Medicine

## 2013-07-29 ENCOUNTER — Ambulatory Visit (INDEPENDENT_AMBULATORY_CARE_PROVIDER_SITE_OTHER): Payer: Medicare Other | Admitting: Internal Medicine

## 2013-07-29 ENCOUNTER — Encounter: Payer: Self-pay | Admitting: Internal Medicine

## 2013-07-29 ENCOUNTER — Telehealth: Payer: Self-pay | Admitting: Internal Medicine

## 2013-07-29 VITALS — BP 122/70 | HR 94 | Ht 66.5 in | Wt 160.0 lb

## 2013-07-29 DIAGNOSIS — R05 Cough: Secondary | ICD-10-CM

## 2013-07-29 DIAGNOSIS — J984 Other disorders of lung: Secondary | ICD-10-CM

## 2013-07-29 DIAGNOSIS — J441 Chronic obstructive pulmonary disease with (acute) exacerbation: Secondary | ICD-10-CM

## 2013-07-29 DIAGNOSIS — J449 Chronic obstructive pulmonary disease, unspecified: Secondary | ICD-10-CM

## 2013-07-29 MED ORDER — TRAMADOL HCL 50 MG PO TABS: ORAL_TABLET | ORAL | Status: DC

## 2013-07-29 MED ORDER — LEVALBUTEROL HCL 0.63 MG/3ML IN NEBU
0.6300 mg | INHALATION_SOLUTION | Freq: Once | RESPIRATORY_TRACT | Status: AC
  Administered 2013-07-29: 0.63 mg via RESPIRATORY_TRACT

## 2013-07-29 MED ORDER — METHYLPREDNISOLONE ACETATE 80 MG/ML IJ SUSP
80.0000 mg | Freq: Once | INTRAMUSCULAR | Status: AC
  Administered 2013-07-29: 80 mg via INTRAMUSCULAR

## 2013-07-29 MED ORDER — METHYLPREDNISOLONE 8 MG PO TABS: ORAL_TABLET | ORAL | Status: DC

## 2013-07-29 NOTE — Telephone Encounter (Signed)
Please see if patient can be here today at 2pm. Thanks.

## 2013-07-29 NOTE — Telephone Encounter (Signed)
Spoke with pt and appt scheduled. Nothing further needed 

## 2013-07-29 NOTE — Patient Instructions (Addendum)
Neb xop   Depo 80  Order CXR  Dx acute bronchitis  Script for tramadol for cough  Ok to add an otc cough syrup as needed - like Delsym, or Mucinex DM

## 2013-07-29 NOTE — Telephone Encounter (Signed)
I called spoke with pt. He c/o prod cough w/ clear, sob all the time, wheezing, chest tx, feels like fluid in lungs x 1 1/2 week. Pt saw PCP Dr. Kathrynn Running 07/21/13 and gave pred taper 20 mg 3 tabs daily x 1 day, 2 tabs daily x 1 day, then 1 tab daily x 1 day--now finished with this. Pt then saw Dr. Eugenie Birks on Monday was giving ZPAK. Still pt is not feeling better. He is requesting to be seen today by Dr. [name]. Please advise thanks  Allergies  Allergen Reactions  . Tiotropium Bromide Monohydrate Other (See Comments)    Caused prostate issues, could not void.    Marland Kitchen Antihistamines, Diphenhydramine-Type Other (See Comments)    Keeps patient awake  . Codeine Itching    Nightmares, feels itchy inside-per patient  . Morphine Other (See Comments)    Unknown, possible itchy feelings inside, nightmares  . Other Itching    Surgical Tape

## 2013-07-29 NOTE — Progress Notes (Signed)
Patient ID: SEVAN MCBROOM, male    DOB: 1941-04-06, 72 y.o.   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 Last here 11/01/10. Note reviewed. He still feels more short of breath than is comfortable- easily fatigued with exertion and doesn't understand why with good oxygen levels.  Went down to [employer] to dance - not any harder than here. He came back with a cough and was given a cough pill. Advair sample seemed better than spiriva, but is not covered by [place] and he has lost job. Feet still swell. Denies chest pain, palpitation.  PFT today- Severe emphysema. FEV1/FVC 0.39, DLCO 0.36, insignif resp to bronchodilator.  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 Still goes dancing regularly. Cardiologist told him heart was okay and that shortness of breath was due to his lungs. He is now using Asmanex 220, Foradil, as provided by the [place]. There has been no change in his ankle edema which is not dependent. He has no history of DVT. Dr Arlyce Dice did a six-month followup chest x-ray, now 8 years postoperatively after his right upper lobectomy for cancer. The patient wants a chest x-ray at least once a year by me for ongoing long-term surveillance. We discussed chest x-ray versus CT scan. Chest x-ray: 04/12/2011-stable right middle lobe nodule and postoperative scarring. Images were reviewed with him.  09/20/11- 72 yoM former smoker, followed for COPD complicated by hx RUL AdenoCa/ lobectomy  Increased SOB more with activity-including putting on shoes and drying off out of the shower x 5-6 weeks; been to PCP 3 times-was told COPD with exacerbation. Treated with antibiotics and prednisone tapers. Has 3 more days on current taper. Head chest x-ray February 16 showing chronic right middle lobe scarring, pectus excavatum. His exercise was dancing and he hasn't been  able to dance because of shortness of breath. Blames lack of exercise and repeated prednisone tapers for 12 pound weight gain and admits he is eating more. We discussed steroid side effects. Wakes with malaise in the morning, weak, scant cough, nasal congestion. Denies sore throat, fever, purulent sputum, palpitation, chest pain. Can't afford pulmonary rehabilitation not covered by Medicare. Asks cheap alternative for Pramosone steroid cream for rectal itch.   12/20/11- 72 yoM former smoker, followed for COPD complicated by hx RUL AdenoCa/ lobectomy  Still having SOB and states not doing good-not able to explain. Increased shortness of breath with exertion. Stays congested with cough and wheeze. He thinks weather is the problem. [facility] gave oxygen 2 L per minute. He has only used it for a total of about 10 minutes since he has had it. He has an oximeter. We reviewed indications for oxygen and assured him it would not make him more dependent. He notices leg edema but is not using his Lasix because he considers diuresis "a nuisance". CXR 04/12/11 reviewed with him IMPRESSION:  Stable nodular area consistent with postoperative scarring in the  right middle lobe. COPD.  Original Report Authenticated By: [name], M.D.    03/11/12- 72 yoM former smoker, followed for COPD complicated by hx RUL AdenoCa/ lobectomy Consistent cough for about 1 month-"crashed on Tramadol"unable to tell big difference with Arcapta He notices gradual weight loss and he is pleased because he is reaching his desired weight although it isn't clear why he is losing the weight. Cough has been more productive-  he blames "overwork in hot weather". His primary physician saw him July 27 and gave prednisone and antibiotic. Chest x-ray showed no pneumonia. He dislikes Delsym. Tramadol helped cough but made him dizzy. Nebulizer sometimes helps. 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 Still having SOB with exertion-normally this doesnt happen; took breathing tx and breathing pills this morning.  CXR- 03/25/12- at Dr Johny Shears- NAD.  COPD Assessment Test (CAT) score 7/ 40 Home oxygen from a [place] DME company at 2 L. He doesn't use it much. He says he sleeps up and down between sofa and bad. The noise of his oxygen machine bothers him so he tries to limit use. Dyspnea on exertion from the car into his office.  12/11/12- 72 yoM former smoker, followed for COPD/ Gold 3 complicated by hx RUL AdenoCa/ lobectomy      Accompanied today by Dr [name]- friend Follows for : pt states having increase sob, wheezing,  with activity . pt states also has a croup sounding cough.,chest congestion. Was treated with Celebrex for prostatitis. Had increased shortness of breath x 3 weeks, so he quit Celebrex 10 days ago. Productive cough with clear mucus x1 week. Sore right chest on waking for the past several mornings but no tussive pain and no adenopathy. Legs have been tender and feeling more swollen. No history of DVT - self or family.  Does better with albuterol by nebulizer than with metered inhalers.  04/02/13- 72 yoM former smoker, followed for COPD/ Gold 3, lung nodules, complicated by hx RUL AdenoCa/ lobectomy    PET scan was in nonmalignant range. He realizes nodules may be very slow-growing adenocarcinoma  and will keep appointment October 13 for followup chest CT    He has been arguing with the [place] about payment of their bill for oximetry. He has wanted to keep home oxygen but did not desaturate enough on a limited walk test at the [place]. He did qualify on repeat and has oxygen at 2 L used as needed. Some productive cough. He feels well and can dance long enough to complete one song but not two. CT chest 12/17/12 IMPRESSION:  1. No evidence for  acute pulmonary embolus.  2. Suspicious, bilateral upper lobe nodules. Cannot rule out  small pulmonary neoplasms. Advise further evaluation with PET CT.  Original Report Authenticated By: Kerby Moors, M.D.  PET 12/30/12 IMPRESSION:  1. Bilateral upper lobe nodular densities appear less conspicuous  on today's study. There is no malignant range FDG uptake  associated these structures which are favored to be the sequela of  inflammation or infection. However, because low grade pulmonary  adenocarcinoma is may exhibit low level FDG uptake I would advise  follow-up imaging to confirm continued resolution/ stability. The  next follow-up examination should be obtained in 3 months. At this  time a diagnostic CT of the chest should be obtained.  Original Report Authenticated By: Kerby Moors, M.D.  07/29/13-  72 yoM former smoker, followed for COPD/ Gold 3, lung nodules, complicated by hx RUL AdenoCa/ lobectomy    ACUTE VISIT: cough x 1 week-productive-no color but thick; Increased SOB; wheezing. Denies any fever or chills. 10 days of cough, productive white, on Z pak from urgent care. Sore throat resolved.. Started lasix- BNP was 214 on 07/26/13.  Was given  pred taper 12/22 which has not helped.  CT 05/12/13 IMPRESSION:  1. Focal masslike opacity seen on the prior study, 1 in the  posterior right upper lobe in the other in the left upper lobe have  mostly resolved. The right upper lobe lesion is now reflected by  minor reticular scarring and anterior tenting of the obliques  fissure. The left upper lesion is reflected by an area of mild  reticular scarring. There is no residual mass or masslike opacity.  No additional followup of these abnormalities is indicated. These  areas were due to infection or inflammation that has subsequently  resolved.  2. 4 mm left lower lobe performed base nodule is stable.  3. Advanced changes of emphysema and other areas of mild reticular  lung scarring are  also stable. No new lung abnormalities.  Electronically Signed  By: Amie Portland M.D.  On: 05/12/2013 14:05   Review of Systems-see HPI Constitutional:   No-  weight loss, No-night sweats, fevers, chills, fatigue, lassitude. HEENT:   No-  headaches, difficulty swallowing, tooth/dental problems, sore throat,       No-  sneezing, itching, ear ache, nasal congestion, post nasal drip,  CV:  No- chest pain, no-orthopnea, PND, + swelling in lower extremities, no-dizziness, palpitations Resp: + shortness of breath with exertion or at rest.                + Little productive cough,  + non-productive cough,  No- coughing up of blood.              No-   change in color of mucus.  No- wheezing.   Skin: No-   rash or lesions. GI:  No-   heartburn, indigestion, abdominal pain, nausea, vomiting,  GU:  MS:  No-   joint pain or swelling.   Neuro-     nothing unusual Psych:  No- change in mood or affect. No depression or anxiety.  No memory loss.    Objective:   Physical Exam  General- Alert, Oriented, Affect-appropriate, Distress- none acute; looks well Skin- rash-none, lesions- none, excoriation- none Lymphadenopathy- none Head- atraumatic            Eyes- Gross vision intact, PERRLA, conjunctivae clear secretions            Ears- Hearing, canals-normal            Nose- Clear, no-Septal dev, mucus, polyps, erosion, perforation             Throat- Mallampati II , mucosa clear , drainage- none, tonsils- atrophic Neck- flexible , trachea midline, no stridor , thyroid nl, carotid no bruit Chest - symmetrical excursion , unlabored           Heart/CV- RRR + rapid , no murmur , no gallop  , no rub, nl s1 s2                           - JVD- none , edema- trace, stasis changes- none, varices- none           Lung-  + hacking cough,Distant/clear, wheeze-none, unlabored dullness-none, rub- none.           Chest wall-  Abd- no HSM Br/ Gen/ Rectal- Not done, not indicated Extrem- cyanosis- none, clubbing,  none, atrophy- none, strength- nl, heavy legs, - Homan's Neuro- grossly intact to observation

## 2013-08-04 ENCOUNTER — Ambulatory Visit: Payer: Medicare Other | Admitting: Internal Medicine

## 2013-08-20 DIAGNOSIS — J441 Chronic obstructive pulmonary disease with (acute) exacerbation: Secondary | ICD-10-CM | POA: Insufficient documentation

## 2013-08-20 NOTE — Assessment & Plan Note (Signed)
Acute bronchitis with underlying severe COPD Plan-handicapped parking, Xopenex nebulizer treatment, Depo-Medrol, chest x-ray, tramadol for cough

## 2013-08-20 NOTE — Assessment & Plan Note (Signed)
On last imaging these were fading, consistent with inflammatory nodules

## 2013-09-27 DIAGNOSIS — J9621 Acute and chronic respiratory failure with hypoxia: Secondary | ICD-10-CM | POA: Insufficient documentation

## 2013-10-27 ENCOUNTER — Ambulatory Visit (INDEPENDENT_AMBULATORY_CARE_PROVIDER_SITE_OTHER): Payer: Medicare Other | Admitting: Internal Medicine

## 2013-10-27 ENCOUNTER — Encounter: Payer: Self-pay | Admitting: Internal Medicine

## 2013-10-27 VITALS — BP 118/70 | HR 129 | Ht 66.5 in | Wt 166.8 lb

## 2013-10-27 DIAGNOSIS — J441 Chronic obstructive pulmonary disease with (acute) exacerbation: Secondary | ICD-10-CM

## 2013-10-27 DIAGNOSIS — C3490 Malignant neoplasm of unspecified part of unspecified bronchus or lung: Secondary | ICD-10-CM

## 2013-10-27 MED ORDER — HYDROCODONE-HOMATROPINE 5-1.5 MG/5ML PO SYRP: 5.0000 mL | ORAL_SOLUTION | Freq: Four times a day (QID) | ORAL | Status: DC | PRN

## 2013-10-27 MED ORDER — ALBUTEROL SULFATE HFA 108 (90 BASE) MCG/ACT IN AERS: 2.0000 | INHALATION_SPRAY | Freq: Four times a day (QID) | RESPIRATORY_TRACT | Status: DC | PRN

## 2013-10-27 NOTE — Patient Instructions (Addendum)
Try calling local DME companies like [facility], [employer], [facility]. See what they have for light portable O2 systems, especially portable concentrators able to deliver 2-3 L/Min  Try sample Breo Ellipta instead of Foradil and Asmanex     1 puff then rinse mouth, once daily  Script to try cough syrup- start with 1/2 teaspoon to test  Script for albuterol rescue inhaler printed

## 2013-10-27 NOTE — Progress Notes (Signed)
Patient ID: [name], male    DOB: 05-27-1941, 73 y.o.   MRN: [id number]  HPI 12/15/10- 73 yoM former smoker, followed for COPD complicated by hx RUL AdenoCa/ lobectomy 2005. Last here 11/01/10. Note reviewed. He still feels more short of breath than is comfortable- easily fatigued with exertion and doesn't understand why with good oxygen levels.  Went down to [employer] to dance - not any harder than here. He came back with a cough and was given a cough pill. Advair sample seemed better than spiriva, but is not covered by [place] and he has lost job. Feet still swell. Denies chest pain, palpitation.  PFT today- Severe emphysema. FEV1/FVC 0.39, DLCO 0.36, insignif resp to bronchodilator.  06/18/11- 73 yoM former smoker, followed for COPD complicated by hx RUL AdenoCa/ lobectomy 2005 Has had flu vaccine. Still goes dancing regularly. Cardiologist told him heart was okay and that shortness of breath was due to his lungs. He is now using Asmanex 220, Foradil, as provided by the [place]. There has been no change in his ankle edema which is not dependent. He has no history of DVT. Dr Arlyce Dice did a six-month followup chest x-ray, now 8 years postoperatively after his right upper lobectomy for cancer. The patient wants a chest x-ray at least once a year by me for ongoing long-term surveillance. We discussed chest x-ray versus CT scan. Chest x-ray: 04/12/2011-stable right middle lobe nodule and postoperative scarring. Images were reviewed with him.  09/20/11- 73 yoM former smoker, followed for COPD complicated by hx RUL AdenoCa/ lobectomy  Increased SOB more with activity-including putting on shoes and drying off out of the shower x 5-6 weeks; been to PCP 3 times-was told COPD with exacerbation. Treated with antibiotics and prednisone tapers. Has 3 more days on current taper. Head chest x-ray February 16 showing chronic right middle lobe scarring, pectus excavatum. His exercise was dancing and he hasn't been  able to dance because of shortness of breath. Blames lack of exercise and repeated prednisone tapers for 12 pound weight gain and admits he is eating more. We discussed steroid side effects. Wakes with malaise in the morning, weak, scant cough, nasal congestion. Denies sore throat, fever, purulent sputum, palpitation, chest pain. Can't afford pulmonary rehabilitation not covered by Medicare. Asks cheap alternative for Pramosone steroid cream for rectal itch.   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 Increased shortness of breath with exertion. Stays congested with cough and wheeze. He thinks weather is the problem. [facility] gave oxygen 2 L per minute. He has only used it for a total of about 10 minutes since he has had it. He has an oximeter. We reviewed indications for oxygen and assured him it would not make him more dependent. He notices leg edema but is not using his Lasix because he considers diuresis "a nuisance". CXR 04/12/11 reviewed with him IMPRESSION:  Stable nodular area consistent with postoperative scarring in the  right middle lobe. COPD.  Original Report Authenticated By: [name], M.D.    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 Cough has been more productive-  he blames "overwork in hot weather". His primary physician saw him July 27 and gave prednisone and antibiotic. Chest x-ray showed no pneumonia. He dislikes Delsym. Tramadol helped cough but made him dizzy. Nebulizer sometimes helps. 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 Still having SOB with exertion-normally this doesnt happen; took breathing tx and breathing pills this morning.  CXR- 03/25/12- at Dr Johny Shears- NAD.  COPD Assessment Test (CAT) score 7/ 40 Home oxygen from a [place] [employer] company at 2 L. He doesn't use it much. He says he sleeps up and down between sofa and bad. The noise of his oxygen machine bothers him so he tries to limit use. Dyspnea on exertion from the car into his office.  12/11/12- 73 yoM former smoker, followed for COPD/ Gold 3 complicated by hx RUL AdenoCa/ lobectomy      Accompanied today by Dr [name]- friend Follows for : pt states having increase sob, wheezing,  with activity . pt states also has a croup sounding cough.,chest congestion. Was treated with Celebrex for prostatitis. Had increased shortness of breath x 3 weeks, so he quit Celebrex 10 days ago. Productive cough with clear mucus x1 week. Sore right chest on waking for the past several mornings but no tussive pain and no adenopathy. Legs have been tender and feeling more swollen. No history of DVT - self or family.  Does better with albuterol by nebulizer than with metered inhalers.  04/02/13- 73 yoM former smoker, followed for COPD/ Gold 3, lung nodules, complicated by hx RUL AdenoCa/ lobectomy    PET scan was in nonmalignant range. He realizes nodules may be very slow-growing adenocarcinoma  and will keep appointment October 13 for followup chest CT    He has been arguing with the [place] about payment of their bill for oximetry. He has wanted to keep home oxygen but did not desaturate enough on a limited walk test at the [place]. He did qualify on repeat and has oxygen at 2 L used as needed. Some productive cough. He feels well and can dance long enough to complete one song but not two. CT chest 12/17/12 IMPRESSION:  1. No evidence for  acute pulmonary embolus.  2. Suspicious, bilateral upper lobe nodules. Cannot rule out  small pulmonary neoplasms. Advise further evaluation with PET CT.  Original Report Authenticated By: Kerby Moors, M.D.  PET 12/30/12 IMPRESSION:  1. Bilateral upper lobe nodular densities appear less conspicuous  on today's study. There is no malignant range FDG uptake  associated these structures which are favored to be the sequela of  inflammation or infection. However, because low grade pulmonary  adenocarcinoma is may exhibit low level FDG uptake I would advise  follow-up imaging to confirm continued resolution/ stability. The  next follow-up examination should be obtained in 3 months. At this  time a diagnostic CT of the chest should be obtained.  Original Report Authenticated By: Kerby Moors, M.D.  07/29/13-  72 yoM former smoker, followed for COPD/ Gold 3, lung nodules, complicated by hx RUL AdenoCa/ lobectomy    ACUTE VISIT: cough x 1 week-productive-no color but thick; Increased SOB; wheezing. Denies any fever or chills. 10 days of cough, productive white, on Z pak from urgent care. Sore throat resolved.. Started lasix- BNP was 214 on 07/26/13.  Was given  pred taper 12/22 which has not helped.  CT 05/12/13 IMPRESSION:  1. Focal masslike opacity seen on the prior study, 1 in the  posterior right upper lobe in the other in the left upper lobe have  mostly resolved. The right upper lobe lesion is now reflected by  minor reticular scarring and anterior tenting of the obliques  fissure. The left upper lesion is reflected by an area of mild  reticular scarring. There is no residual mass or masslike opacity.  No additional followup of these abnormalities is indicated. These  areas were due to infection or inflammation that has subsequently  resolved.  2. 4 mm left lower lobe performed base nodule is stable.  3. Advanced changes of emphysema and other areas of mild reticular  lung scarring are  also stable. No new lung abnormalities.  Electronically Signed  By: Lajean Manes M.D.  On: 05/12/2013 14:05  10/27/13-72 yoM former smoker, followed for COPD/ Gold 3, lung nodules, complicated by hx RUL AdenoCa/ lobectomy    FOLLOWS FOR: Recently in hospital for COPD exacerbation at [facility], pneumonia left lung.. Pt states he contines to have more SOB than usual. Discharged on Levaquin ending March 5. Using oxygen 3 L/[facility] Cough is less productive and sputum is clear/scant. Pleuritic left rib pain is gone. If not portable oxygen too heavy but desaturates with exertion to 88% on room air. We discussed concentrator. CXR 07/29/13 IMPRESSION:  1. Lung hyperexpansion and bronchitic change without acute  cardiopulmonary disease.  2. Stable postsurgical change of the anterior aspect of the right  middle lobe.  Electronically Signed  By: Sandi Mariscal M.D.  On: 07/29/2013 16:01    Review of Systems-see HPI Constitutional:   No-  weight loss, No-night sweats, fevers, chills, fatigue, lassitude. HEENT:   No-  headaches, difficulty swallowing, tooth/dental problems, sore throat,       No-  sneezing, itching, ear ache, nasal congestion, post nasal drip,  CV:  No- chest pain, no-orthopnea, PND, + swelling in lower extremities, no-dizziness, palpitations Resp: + shortness of breath with exertion or at rest.                + Little productive cough,  + non-productive cough,  No- coughing up of blood.              No-   change in color of mucus.  No- wheezing.   Skin: No-   rash or lesions. GI:  No-   heartburn, indigestion, abdominal pain, nausea, vomiting,  GU:  MS:  No-   joint pain or swelling.   Neuro-     nothing unusual Psych:  No- change in mood or affect. No depression or anxiety.  No memory loss.    Objective:   Physical Exam  General- Alert, Oriented, Affect-appropriate, Distress- none acute; looks well Skin- rash-none, lesions- none, excoriation- none Lymphadenopathy-  none Head- atraumatic            Eyes- Gross vision intact, PERRLA, conjunctivae clear secretions            Ears- Hearing, canals-normal            Nose- Clear, no-Septal dev, mucus, polyps, erosion, perforation             Throat- Mallampati II , mucosa clear , drainage- none, tonsils- atrophic Neck- flexible , trachea midline, no stridor , thyroid nl, carotid no bruit Chest - symmetrical excursion , unlabored  Heart/CV- RRR + rapid , no murmur , no gallop  , no rub, nl s1 s2                           - JVD- none , edema- trace, stasis changes- none, varices- none           Lung-   Cough+dry,Distant/clear, wheeze-none, unlabored dullness-none, rub- none.           Chest wall-  Abd- no HSM Br/ Gen/ Rectal- Not done, not indicated Extrem- cyanosis- none, clubbing, none, atrophy- none, strength- nl, heavy legs, - Homan's Neuro- grossly intact to observation

## 2013-11-22 NOTE — Assessment & Plan Note (Signed)
No recurrence so far 

## 2013-11-22 NOTE — Assessment & Plan Note (Signed)
Pneumonia clinically resolved. Oxygen dependent COPD. Plan-walk test on room air to qualify for portable oxygen if possible. Refill rescue inhaler

## 2014-01-19 ENCOUNTER — Other Ambulatory Visit: Payer: Self-pay | Admitting: Internal Medicine

## 2014-02-23 ENCOUNTER — Encounter: Payer: Self-pay | Admitting: Internal Medicine

## 2014-02-23 ENCOUNTER — Ambulatory Visit (INDEPENDENT_AMBULATORY_CARE_PROVIDER_SITE_OTHER): Payer: Medicare Other | Admitting: Internal Medicine

## 2014-02-23 VITALS — BP 122/68 | HR 88 | Ht 66.5 in | Wt 152.2 lb

## 2014-02-23 DIAGNOSIS — C3490 Malignant neoplasm of unspecified part of unspecified bronchus or lung: Secondary | ICD-10-CM

## 2014-02-23 DIAGNOSIS — J441 Chronic obstructive pulmonary disease with (acute) exacerbation: Secondary | ICD-10-CM

## 2014-02-23 NOTE — Progress Notes (Signed)
Patient ID: [name], male    DOB: 1941-04-06, 73 y.o.   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 Last here 11/01/10. Note reviewed. He still feels more short of breath than is comfortable- easily fatigued with exertion and doesn't understand why with good oxygen levels.  Went down to [employer] to dance - not any harder than here. He came back with a cough and was given a cough pill. Advair sample seemed better than spiriva, but is not covered by [place] and he has lost job. Feet still swell. Denies chest pain, palpitation.  PFT today- Severe emphysema. FEV1/FVC 0.39, DLCO 0.36, insignif resp to bronchodilator.  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 Still goes dancing regularly. Cardiologist told him heart was okay and that shortness of breath was due to his lungs. He is now using Asmanex 220, Foradil, as provided by the [place]. There has been no change in his ankle edema which is not dependent. He has no history of DVT. Dr Arlyce Dice did a six-month followup chest x-ray, now 8 years postoperatively after his right upper lobectomy for cancer. The patient wants a chest x-ray at least once a year by me for ongoing long-term surveillance. We discussed chest x-ray versus CT scan. Chest x-ray: 04/12/2011-stable right middle lobe nodule and postoperative scarring. Images were reviewed with him.  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 Treated with antibiotics and prednisone tapers. Has 3 more days on current taper. Head chest x-ray February 16 showing chronic right middle lobe scarring, pectus excavatum. His exercise was dancing and he hasn't been  able to dance because of shortness of breath. Blames lack of exercise and repeated prednisone tapers for 12 pound weight gain and admits he is eating more. We discussed steroid side effects. Wakes with malaise in the morning, weak, scant cough, nasal congestion. Denies sore throat, fever, purulent sputum, palpitation, chest pain. Can't afford pulmonary rehabilitation not covered by Medicare. Asks cheap alternative for Pramosone steroid cream for rectal itch.   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 Increased shortness of breath with exertion. Stays congested with cough and wheeze. He thinks weather is the problem. [facility] gave oxygen 2 L per minute. He has only used it for a total of about 10 minutes since he has had it. He has an oximeter. We reviewed indications for oxygen and assured him it would not make him more dependent. He notices leg edema but is not using his Lasix because he considers diuresis "a nuisance". CXR 04/12/11 reviewed with him IMPRESSION:  Stable nodular area consistent with postoperative scarring in the  right middle lobe. COPD.  Original Report Authenticated By: [name], M.D.    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 Cough has been more productive-  he blames "overwork in hot weather". His primary physician saw him July 27 and gave prednisone and antibiotic. Chest x-ray showed no pneumonia. He dislikes Delsym. Tramadol helped cough but made him dizzy. Nebulizer sometimes helps. 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 Still having SOB with exertion-normally this doesnt happen; took breathing tx and breathing pills this morning.  CXR- 03/25/12- at Dr Johny Shears- NAD.  COPD Assessment Test (CAT) score 7/ 40 Home oxygen from a [place] [employer] company at 2 L. He doesn't use it much. He says he sleeps up and down between sofa and bad. The noise of his oxygen machine bothers him so he tries to limit use. Dyspnea on exertion from the car into his office.  12/11/12- 56 yoM former smoker, followed for COPD/ Gold 3 complicated by hx RUL AdenoCa/ lobectomy      Accompanied today by Dr [name]- friend Follows for : pt states having increase sob, wheezing,  with activity . pt states also has a croup sounding cough.,chest congestion. Was treated with Celebrex for prostatitis. Had increased shortness of breath x 3 weeks, so he quit Celebrex 10 days ago. Productive cough with clear mucus x1 week. Sore right chest on waking for the past several mornings but no tussive pain and no adenopathy. Legs have been tender and feeling more swollen. No history of DVT - self or family.  Does better with albuterol by nebulizer than with metered inhalers.  04/02/13- 49 yoM former smoker, followed for COPD/ Gold 3, lung nodules, complicated by hx RUL AdenoCa/ lobectomy    PET scan was in nonmalignant range. He realizes nodules may be very slow-growing adenocarcinoma  and will keep appointment October 13 for followup chest CT    He has been arguing with the [place] about payment of their bill for oximetry. He has wanted to keep home oxygen but did not desaturate enough on a limited walk test at the [place]. He did qualify on repeat and has oxygen at 2 L used as needed. Some productive cough. He feels well and can dance long enough to complete one song but not two. CT chest 12/17/12 IMPRESSION:  1. No evidence for  acute pulmonary embolus.  2. Suspicious, bilateral upper lobe nodules. Cannot rule out  small pulmonary neoplasms. Advise further evaluation with PET CT.  Original Report Authenticated By: Kerby Moors, M.D.  PET 12/30/12 IMPRESSION:  1. Bilateral upper lobe nodular densities appear less conspicuous  on today's study. There is no malignant range FDG uptake  associated these structures which are favored to be the sequela of  inflammation or infection. However, because low grade pulmonary  adenocarcinoma is may exhibit low level FDG uptake I would advise  follow-up imaging to confirm continued resolution/ stability. The  next follow-up examination should be obtained in 3 months. At this  time a diagnostic CT of the chest should be obtained.  Original Report Authenticated By: Kerby Moors, M.D.  07/29/13-  72 yoM former smoker, followed for COPD/ Gold 3, lung nodules, complicated by hx RUL AdenoCa/ lobectomy    ACUTE VISIT: cough x 1 week-productive-no color but thick; Increased SOB; wheezing. Denies any fever or chills. 10 days of cough, productive white, on Z pak from urgent care. Sore throat resolved.. Started lasix- BNP was 214 on 07/26/13.  Was given  pred taper 12/22 which has not helped.  CT 05/12/13 IMPRESSION:  1. Focal masslike opacity seen on the prior study, 1 in the  posterior right upper lobe in the other in the left upper lobe have  mostly resolved. The right upper lobe lesion is now reflected by  minor reticular scarring and anterior tenting of the obliques  fissure. The left upper lesion is reflected by an area of mild  reticular scarring. There is no residual mass or masslike opacity.  No additional followup of these abnormalities is indicated. These  areas were due to infection or inflammation that has subsequently  resolved.  2. 4 mm left lower lobe performed base nodule is stable.  3. Advanced changes of emphysema and other areas of mild reticular  lung scarring are  also stable. No new lung abnormalities.  Electronically Signed  By: Lajean Manes M.D.  On: 05/12/2013 14:05  10/27/13-72 yoM former smoker, followed for COPD/ Gold 3, lung nodules, complicated by hx RUL AdenoCa/ lobectomy    FOLLOWS FOR: Recently in hospital for COPD exacerbation at [facility], pneumonia left lung.. Pt states he contines to have more SOB than usual. Discharged on Levaquin ending March 5. Using oxygen 3 L/[facility] Cough is less productive and sputum is clear/scant. Pleuritic left rib pain is gone. If not portable oxygen too heavy but desaturates with exertion to 88% on room air. We discussed concentrator. CXR 07/29/13 IMPRESSION:  1. Lung hyperexpansion and bronchitic change without acute  cardiopulmonary disease.  2. Stable postsurgical change of the anterior aspect of the right  middle lobe.  Electronically Signed  By: Sandi Mariscal M.D.  On: 07/29/2013 16:01   02/23/14- 17 yoM former smoker, followed for COPD/ Gold 3, lung nodules, complicated by hx RUL AdenoCa/ lobectomy  FOLLOWS FOR:  Reports having some chest congestion.  Denies any cough or wheezing Not using oxygen 3 L/[facility] [facility]. Saturations at home running 96-97% on room air To 3 days of increased chest congestion with white phlegm, not an infection Still using foradil and theophylline, nebulizer twice daily Walking 1.25 miles daily and portable oxygen was too heavy. He paces himself. Oxygen did help when he went dancing because he could go to the side and use it between dances.  Review of Systems-see HPI Constitutional:   No-  weight loss, No-night sweats, fevers, chills, fatigue, lassitude. HEENT:   No-  headaches, difficulty swallowing, tooth/dental problems, sore throat,       No-  sneezing, itching, ear ache, nasal congestion, post nasal drip,  CV:  No- chest pain, no-orthopnea, PND, + swelling in lower extremities, no-dizziness, palpitations Resp: + shortness of breath with exertion or at rest.                 + Little productive cough,  + non-productive cough,  No- coughing up of blood.              No-   change in color of mucus.  No- wheezing.   Skin: No-   rash or lesions. GI:  No-   heartburn, indigestion, abdominal pain, nausea, vomiting,  GU:  MS:  No-   joint pain or swelling.   Neuro-     nothing unusual Psych:  No- change in mood or affect. No depression or anxiety.  No memory loss.    Objective:   Physical Exam  General- Alert, Oriented, Affect-appropriate, Distress- none acute; looks well Skin- rash-none, lesions- none, excoriation- none Lymphadenopathy- none Head- atraumatic  Eyes- Gross vision intact, PERRLA, conjunctivae clear secretions            Ears- Hearing, canals-normal            Nose- Clear, no-Septal dev, mucus, polyps, erosion, perforation             Throat- Mallampati II , mucosa clear , drainage- none, tonsils- atrophic Neck- flexible , trachea midline, no stridor , thyroid nl, carotid no bruit Chest - symmetrical excursion , unlabored           Heart/CV- RRR  , no murmur , no gallop  , no rub, nl s1 s2                           - JVD- none , edema- trace, stasis changes- none, varices- none           Lung-   Cough-none,Distant/clear, wheeze-none, unlabored dullness-none, rub- none.           Chest wall-  Abd- no HSM Br/ Gen/ Rectal- Not done, not indicated Extrem- cyanosis- none, clubbing, none, atrophy- none, strength- nl, heavy legs, - Homan's Neuro- grossly intact to observation

## 2014-02-23 NOTE — Patient Instructions (Signed)
Consider Mucinex for the sense of congestion  Any exercise is better than none  We can continue present meds

## 2014-03-25 ENCOUNTER — Other Ambulatory Visit: Payer: Self-pay | Admitting: Internal Medicine

## 2014-05-31 NOTE — Assessment & Plan Note (Signed)
No recurrence so far 

## 2014-05-31 NOTE — Assessment & Plan Note (Signed)
Minor nonspecific exacerbation with increased mucus Plan-Mucinex DM

## 2014-06-29 ENCOUNTER — Ambulatory Visit (INDEPENDENT_AMBULATORY_CARE_PROVIDER_SITE_OTHER): Payer: Medicare Other | Admitting: Internal Medicine

## 2014-06-29 ENCOUNTER — Encounter: Payer: Self-pay | Admitting: Internal Medicine

## 2014-06-29 ENCOUNTER — Ambulatory Visit (INDEPENDENT_AMBULATORY_CARE_PROVIDER_SITE_OTHER)
Admission: RE | Admit: 2014-06-29 | Discharge: 2014-06-29 | Disposition: A | Discharge status: Home / Self Care | Payer: Medicare Other | Source: Ambulatory Visit | Attending: Internal Medicine | Admitting: Internal Medicine

## 2014-06-29 ENCOUNTER — Encounter (INDEPENDENT_AMBULATORY_CARE_PROVIDER_SITE_OTHER): Payer: Self-pay

## 2014-06-29 VITALS — BP 118/82 | HR 71 | Ht 66.5 in | Wt 162.4 lb

## 2014-06-29 DIAGNOSIS — J441 Chronic obstructive pulmonary disease with (acute) exacerbation: Secondary | ICD-10-CM

## 2014-06-29 MED ORDER — CEFDINIR 300 MG PO CAPS: 300.0000 mg | ORAL_CAPSULE | Freq: Two times a day (BID) | ORAL | Status: DC

## 2014-06-29 NOTE — Patient Instructions (Signed)
Script sent for cefdinir antibiotic  Order CXR  Dx AECOPD

## 2014-06-29 NOTE — Progress Notes (Signed)
Patient ID: [name], male    DOB: Dec 07, 1940, 73 y.o.   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 Last here 11/01/10. Note reviewed. He still feels more short of breath than is comfortable- easily fatigued with exertion and doesn't understand why with good oxygen levels.  Went down to [employer] to dance - not any harder than here. He came back with a cough and was given a cough pill. Advair sample seemed better than spiriva, but is not covered by [place] and he has lost job. Feet still swell. Denies chest pain, palpitation.  PFT today- Severe emphysema. FEV1/FVC 0.39, DLCO 0.36, insignif resp to bronchodilator.  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 Still goes dancing regularly. Cardiologist told him heart was okay and that shortness of breath was due to his lungs. He is now using Asmanex 220, Foradil, as provided by the [place]. There has been no change in his ankle edema which is not dependent. He has no history of DVT. Dr Arlyce Dice did a six-month followup chest x-ray, now 8 years postoperatively after his right upper lobectomy for cancer. The patient wants a chest x-ray at least once a year by me for ongoing long-term surveillance. We discussed chest x-ray versus CT scan. Chest x-ray: 04/12/2011-stable right middle lobe nodule and postoperative scarring. Images were reviewed with him.  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 Treated with antibiotics and prednisone tapers. Has 3 more days on current taper. Head chest x-ray February 16 showing chronic right middle lobe scarring, pectus excavatum. His exercise was dancing and he hasn't been  able to dance because of shortness of breath. Blames lack of exercise and repeated prednisone tapers for 12 pound weight gain and admits he is eating more. We discussed steroid side effects. Wakes with malaise in the morning, weak, scant cough, nasal congestion. Denies sore throat, fever, purulent sputum, palpitation, chest pain. Can't afford pulmonary rehabilitation not covered by Medicare. Asks cheap alternative for Pramosone steroid cream for rectal itch.   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 Increased shortness of breath with exertion. Stays congested with cough and wheeze. He thinks weather is the problem. [facility] gave oxygen 2 L per minute. He has only used it for a total of about 10 minutes since he has had it. He has an oximeter. We reviewed indications for oxygen and assured him it would not make him more dependent. He notices leg edema but is not using his Lasix because he considers diuresis "a nuisance". CXR 04/12/11 reviewed with him IMPRESSION:  Stable nodular area consistent with postoperative scarring in the  right middle lobe. COPD.  Original Report Authenticated By: [name], M.D.    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 Cough has been more productive-  he blames "overwork in hot weather". His primary physician saw him July 27 and gave prednisone and antibiotic. Chest x-ray showed no pneumonia. He dislikes Delsym. Tramadol helped cough but made him dizzy. Nebulizer sometimes helps. 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 Still having SOB with exertion-normally this doesnt happen; took breathing tx and breathing pills this morning.  CXR- 03/25/12- at Dr Johny Shears- NAD.  COPD Assessment Test (CAT) score 7/ 40 Home oxygen from a [place] [employer] company at 2 L. He doesn't use it much. He says he sleeps up and down between sofa and bad. The noise of his oxygen machine bothers him so he tries to limit use. Dyspnea on exertion from the car into his office.  12/11/12- 42 yoM former smoker, followed for COPD/ Gold 3 complicated by hx RUL AdenoCa/ lobectomy      Accompanied today by Dr [name]- friend Follows for : pt states having increase sob, wheezing,  with activity . pt states also has a croup sounding cough.,chest congestion. Was treated with Celebrex for prostatitis. Had increased shortness of breath x 3 weeks, so he quit Celebrex 10 days ago. Productive cough with clear mucus x1 week. Sore right chest on waking for the past several mornings but no tussive pain and no adenopathy. Legs have been tender and feeling more swollen. No history of DVT - self or family.  Does better with albuterol by nebulizer than with metered inhalers.  04/02/13- 62 yoM former smoker, followed for COPD/ Gold 3, lung nodules, complicated by hx RUL AdenoCa/ lobectomy    PET scan was in nonmalignant range. He realizes nodules may be very slow-growing adenocarcinoma  and will keep appointment October 13 for followup chest CT    He has been arguing with the [place] about payment of their bill for oximetry. He has wanted to keep home oxygen but did not desaturate enough on a limited walk test at the [place]. He did qualify on repeat and has oxygen at 2 L used as needed. Some productive cough. He feels well and can dance long enough to complete one song but not two. CT chest 12/17/12 IMPRESSION:  1. No evidence for  acute pulmonary embolus.  2. Suspicious, bilateral upper lobe nodules. Cannot rule out  small pulmonary neoplasms. Advise further evaluation with PET CT.  Original Report Authenticated By: Kerby Moors, M.D.  PET 12/30/12 IMPRESSION:  1. Bilateral upper lobe nodular densities appear less conspicuous  on today's study. There is no malignant range FDG uptake  associated these structures which are favored to be the sequela of  inflammation or infection. However, because low grade pulmonary  adenocarcinoma is may exhibit low level FDG uptake I would advise  follow-up imaging to confirm continued resolution/ stability. The  next follow-up examination should be obtained in 3 months. At this  time a diagnostic CT of the chest should be obtained.  Original Report Authenticated By: Kerby Moors, M.D.  07/29/13-  72 yoM former smoker, followed for COPD/ Gold 3, lung nodules, complicated by hx RUL AdenoCa/ lobectomy    ACUTE VISIT: cough x 1 week-productive-no color but thick; Increased SOB; wheezing. Denies any fever or chills. 10 days of cough, productive white, on Z pak from urgent care. Sore throat resolved.. Started lasix- BNP was 214 on 07/26/13.  Was given  pred taper 12/22 which has not helped.  CT 05/12/13 IMPRESSION:  1. Focal masslike opacity seen on the prior study, 1 in the  posterior right upper lobe in the other in the left upper lobe have  mostly resolved. The right upper lobe lesion is now reflected by  minor reticular scarring and anterior tenting of the obliques  fissure. The left upper lesion is reflected by an area of mild  reticular scarring. There is no residual mass or masslike opacity.  No additional followup of these abnormalities is indicated. These  areas were due to infection or inflammation that has subsequently  resolved.  2. 4 mm left lower lobe performed base nodule is stable.  3. Advanced changes of emphysema and other areas of mild reticular  lung scarring are  also stable. No new lung abnormalities.  Electronically Signed  By: Lajean Manes M.D.  On: 05/12/2013 14:05  10/27/13-72 yoM former smoker, followed for COPD/ Gold 3, lung nodules, complicated by hx RUL AdenoCa/ lobectomy    FOLLOWS FOR: Recently in hospital for COPD exacerbation at [facility], pneumonia left lung.. Pt states he contines to have more SOB than usual. Discharged on Levaquin ending March 5. Using oxygen 3 L/[employer] Cough is less productive and sputum is clear/scant. Pleuritic left rib pain is gone. If not portable oxygen too heavy but desaturates with exertion to 88% on room air. We discussed concentrator. CXR 07/29/13 IMPRESSION:  1. Lung hyperexpansion and bronchitic change without acute  cardiopulmonary disease.  2. Stable postsurgical change of the anterior aspect of the right  middle lobe.  Electronically Signed  By: Sandi Mariscal M.D.  On: 07/29/2013 16:01   02/23/14- 31 yoM former smoker, followed for COPD/ Gold 3, lung nodules, complicated by hx RUL AdenoCa/ lobectomy  FOLLOWS FOR:  Reports having some chest congestion.  Denies any cough or wheezing Not using oxygen 3 L/[employer]. Saturations at home running 96-97% on room air To 3 days of increased chest congestion with white phlegm, not an infection Still using foradil and theophylline, nebulizer twice daily Walking 1.25 miles daily and portable oxygen was too heavy. He paces himself. Oxygen did help when he went dancing because he could go to the side and use it between dances.  06/29/14- 54 yoM former smoker, followed for COPD/ Gold 3, lung nodules, complicated by hx RUL AdenoCa/ lobectomy  FOLLOWS FOR: Chest congestion; coughing up clear phelgm for 3 weeks now-has been using Mucinex to help.     Review of Systems-see HPI Constitutional:   No-  weight loss, No-night sweats, fevers, chills, fatigue, lassitude. HEENT:   No-  headaches, difficulty swallowing, tooth/dental problems, sore throat,        No-  sneezing, itching, ear ache, nasal congestion, post nasal drip,  CV:  No- chest pain, no-orthopnea, PND, + swelling in lower extremities, no-dizziness, palpitations Resp: + shortness of breath with exertion or at rest.                + Little productive cough,  + non-productive cough,  No- coughing up of blood.              No-   change in color of mucus.  No- wheezing.   Skin: No-   rash or lesions. GI:  No-   heartburn, indigestion, abdominal pain, nausea, vomiting,  GU:  MS:  No-   joint pain or swelling.   Neuro-     nothing unusual Psych:  No- change in mood or affect.  No depression or anxiety.  No memory loss.    Objective:   Physical Exam  General- Alert, Oriented, Affect-appropriate, Distress- none acute; looks well Skin- rash-none, lesions- none, excoriation- none Lymphadenopathy- none Head- atraumatic            Eyes- Gross vision intact, PERRLA, conjunctivae clear secretions            Ears- Hearing, canals-normal            Nose- Clear, no-Septal dev, mucus, polyps, erosion, perforation             Throat- Mallampati II , mucosa clear , drainage- none, tonsils- atrophic Neck- flexible , trachea midline, no stridor , thyroid nl, carotid no bruit Chest - symmetrical excursion , unlabored           Heart/CV- RRR  , no murmur , no gallop  , no rub, nl s1 s2                           - JVD- none , edema- trace, stasis changes- none, varices- none           Lung-   Cough-none,Distant/clear, wheeze-none, unlabored dullness-none, rub- none.           Chest wall-  Abd- no HSM Br/ Gen/ Rectal- Not done, not indicated Extrem- cyanosis- none, clubbing, none, atrophy- none, strength- nl, heavy legs, - Homan's Neuro- grossly intact to observation

## 2014-07-08 ENCOUNTER — Other Ambulatory Visit: Payer: Self-pay

## 2014-07-08 MED ORDER — ALBUTEROL SULFATE (2.5 MG/3ML) 0.083% IN NEBU: 2.5000 mg | INHALATION_SOLUTION | Freq: Four times a day (QID) | RESPIRATORY_TRACT | Status: DC | PRN

## 2014-07-08 MED ORDER — THEOPHYLLINE ER 100 MG PO TB12: 100.0000 mg | ORAL_TABLET | Freq: Two times a day (BID) | ORAL | Status: DC

## 2014-07-21 ENCOUNTER — Telehealth: Payer: Self-pay | Admitting: Internal Medicine

## 2014-07-21 MED ORDER — BUDESONIDE 0.5 MG/2ML IN SUSP: 0.5000 mg | Freq: Two times a day (BID) | RESPIRATORY_TRACT | Status: DC

## 2014-07-21 NOTE — Telephone Encounter (Signed)
I have sent rx and pt was aware  Nothing further needed

## 2014-08-05 ENCOUNTER — Other Ambulatory Visit: Payer: Self-pay | Admitting: Internal Medicine

## 2014-08-05 NOTE — Telephone Encounter (Signed)
ERROR

## 2014-08-17 DIAGNOSIS — J441 Chronic obstructive pulmonary disease with (acute) exacerbation: Secondary | ICD-10-CM | POA: Diagnosis not present

## 2014-08-20 DIAGNOSIS — R310 Gross hematuria: Secondary | ICD-10-CM | POA: Diagnosis not present

## 2014-08-20 DIAGNOSIS — R319 Hematuria, unspecified: Secondary | ICD-10-CM | POA: Diagnosis not present

## 2014-08-20 DIAGNOSIS — R5383 Other fatigue: Secondary | ICD-10-CM | POA: Diagnosis not present

## 2014-08-27 DIAGNOSIS — R310 Gross hematuria: Secondary | ICD-10-CM | POA: Diagnosis not present

## 2014-09-01 ENCOUNTER — Other Ambulatory Visit: Payer: Self-pay | Admitting: Internal Medicine

## 2014-09-01 DIAGNOSIS — Z9079 Acquired absence of other genital organ(s): Secondary | ICD-10-CM | POA: Diagnosis not present

## 2014-09-01 DIAGNOSIS — Z85118 Personal history of other malignant neoplasm of bronchus and lung: Secondary | ICD-10-CM | POA: Diagnosis not present

## 2014-09-01 DIAGNOSIS — R310 Gross hematuria: Secondary | ICD-10-CM | POA: Diagnosis not present

## 2014-09-03 DIAGNOSIS — R310 Gross hematuria: Secondary | ICD-10-CM | POA: Diagnosis not present

## 2014-09-03 DIAGNOSIS — N420 Calculus of prostate: Secondary | ICD-10-CM | POA: Diagnosis not present

## 2014-09-11 ENCOUNTER — Other Ambulatory Visit: Payer: Self-pay | Admitting: Internal Medicine

## 2014-09-21 ENCOUNTER — Telehealth: Payer: Self-pay | Admitting: Pulmonary Disease

## 2014-09-21 ENCOUNTER — Other Ambulatory Visit: Payer: Self-pay | Admitting: Internal Medicine

## 2014-09-21 DIAGNOSIS — J441 Chronic obstructive pulmonary disease with (acute) exacerbation: Secondary | ICD-10-CM | POA: Diagnosis not present

## 2014-09-21 NOTE — Telephone Encounter (Signed)
Error

## 2014-09-28 ENCOUNTER — Encounter (HOSPITAL_COMMUNITY): Payer: Self-pay | Admitting: *Deleted

## 2014-09-28 ENCOUNTER — Emergency Department (HOSPITAL_COMMUNITY): Payer: Medicare Other

## 2014-09-28 ENCOUNTER — Emergency Department (HOSPITAL_COMMUNITY)
Admission: EM | Admit: 2014-09-28 | Discharge: 2014-09-28 | Disposition: A | Discharge status: Home / Self Care | Payer: Medicare Other | Attending: Emergency Medicine | Admitting: Emergency Medicine

## 2014-09-28 DIAGNOSIS — Z79899 Other long term (current) drug therapy: Secondary | ICD-10-CM | POA: Diagnosis not present

## 2014-09-28 DIAGNOSIS — R9431 Abnormal electrocardiogram [ECG] [EKG]: Secondary | ICD-10-CM | POA: Diagnosis not present

## 2014-09-28 DIAGNOSIS — J449 Chronic obstructive pulmonary disease, unspecified: Secondary | ICD-10-CM | POA: Diagnosis present

## 2014-09-28 DIAGNOSIS — Z87438 Personal history of other diseases of male genital organs: Secondary | ICD-10-CM | POA: Insufficient documentation

## 2014-09-28 DIAGNOSIS — J410 Simple chronic bronchitis: Secondary | ICD-10-CM | POA: Diagnosis not present

## 2014-09-28 DIAGNOSIS — R0789 Other chest pain: Secondary | ICD-10-CM

## 2014-09-28 DIAGNOSIS — Z87891 Personal history of nicotine dependence: Secondary | ICD-10-CM | POA: Insufficient documentation

## 2014-09-28 DIAGNOSIS — C3490 Malignant neoplasm of unspecified part of unspecified bronchus or lung: Secondary | ICD-10-CM | POA: Diagnosis not present

## 2014-09-28 DIAGNOSIS — R079 Chest pain, unspecified: Secondary | ICD-10-CM

## 2014-09-28 DIAGNOSIS — Z7951 Long term (current) use of inhaled steroids: Secondary | ICD-10-CM | POA: Diagnosis not present

## 2014-09-28 DIAGNOSIS — Z8701 Personal history of pneumonia (recurrent): Secondary | ICD-10-CM | POA: Diagnosis not present

## 2014-09-28 DIAGNOSIS — R069 Unspecified abnormalities of breathing: Secondary | ICD-10-CM | POA: Diagnosis not present

## 2014-09-28 DIAGNOSIS — R000 Tachycardia, unspecified: Secondary | ICD-10-CM | POA: Diagnosis not present

## 2014-09-28 LAB — COMPREHENSIVE METABOLIC PANEL
ALBUMIN: 3.2 g/dL — AB (ref 3.5–5.2)
ALK PHOS: 94 U/L (ref 39–117)
ALT: 18 U/L (ref 0–53)
ANION GAP: 6 (ref 5–15)
AST: 35 U/L (ref 0–37)
BUN: 10 mg/dL (ref 6–23)
CALCIUM: 8.4 mg/dL (ref 8.4–10.5)
CO2: 30 mmol/L (ref 19–32)
Chloride: 100 mmol/L (ref 96–112)
Creatinine, Ser: 1.13 mg/dL (ref 0.50–1.35)
GFR calc Af Amer: 73 mL/min — ABNORMAL LOW (ref >90)
GFR calc non Af Amer: 63 mL/min — ABNORMAL LOW (ref >90)
Glucose, Bld: 118 mg/dL — ABNORMAL HIGH (ref 70–99)
POTASSIUM: 4 mmol/L (ref 3.5–5.1)
Sodium: 136 mmol/L (ref 135–145)
Total Bilirubin: 0.8 mg/dL (ref 0.3–1.2)
Total Protein: 6.2 g/dL (ref 6.0–8.3)

## 2014-09-28 LAB — CBC
HCT: 41.8 % (ref 39.0–52.0)
HEMOGLOBIN: 13.9 g/dL (ref 13.0–17.0)
MCH: 29 pg (ref 26.0–34.0)
MCHC: 33.3 g/dL (ref 30.0–36.0)
MCV: 87.3 fL (ref 78.0–100.0)
Platelets: 220 10*3/uL (ref 150–400)
RBC: 4.79 MIL/uL (ref 4.22–5.81)
RDW: 13.5 % (ref 11.5–15.5)
WBC: 10.2 10*3/uL (ref 4.0–10.5)

## 2014-09-28 LAB — I-STAT TROPONIN, ED: Troponin i, poc: 0.01 ng/mL (ref 0.00–0.08)

## 2014-09-28 LAB — TROPONIN I

## 2014-09-28 NOTE — ED Notes (Signed)
Pt here [facility] from [facility] on [facility] for t-wave inversion on EKG.  Pt felt chest generalized chest pain on Sat, lasting 15 minutes.  However pt initially went to [facility] for epistaxis (since resolved).  EKG was performed and t-wave changes were noted. 324 mg asa given by ems.  VS stable.

## 2014-09-28 NOTE — ED Notes (Signed)
Cardiology at bedside.

## 2014-09-28 NOTE — ED Notes (Signed)
Cardiology paged and responded.  State pt is next on list to be seen.

## 2014-09-28 NOTE — ED Provider Notes (Signed)
CSN: [id number]     Arrival date & time 09/28/14  0901 History   First MD Initiated Contact with Patient 09/28/14 [phone]     Chief Complaint  Patient presents with  . EKG changes     UCC transfer     (Consider location/radiation/quality/duration/timing/severity/associated sxs/prior Treatment) The history is provided by the patient and medical records.    This is a 74 y.o. M with PMH significant for enlarged prostate, COPD, presenting to the ED for EKG changes.  Patient states over the weekend on Saturday evening he had sudden onset of left sided chest pain while watching basketball game.  States he had no associated SOB, diaphoresis, nausea, vomiting, dizziness, or weakness.  States pain lasted approx 15 minutes before resolving completely.  He did take ASA when pain occurred.  States he has had no recurrence of chest pain but did feel somewhat fatigued the remainder of the weekend.  No dizziness, focal weakness, numbness, or paresthesias of extremities.  No falls or difficulty ambulating.  States he went to urgent care this morning and was sent here for further eval of EKG changes.  VSS on arrival.  Past Medical History  Diagnosis Date  . Prostate enlargement   . PNA (pneumonia)     as a child and 2004  . COPD (chronic obstructive pulmonary disease)   . Cancer     lung ca 2005  . Adenocarcinoma     RUL   Past Surgical History  Procedure Laterality Date  . Lobectomy      RUL 2005 no adjuvant  . Cyst on wrist    . Tonsillectomy    . Transurethral resection of prostate (gyrus vaporization)    . Laparoscopic cholecystectomy    . Left inguinal herniorrhaphy.     Family History  Problem Relation Age of Onset  . Asthma Son    History  Substance Use Topics  . Smoking status: Former Smoker -- 2.50 packs/day for 40 years    Types: Cigarettes    Quit date: 09/09/2002  . Smokeless tobacco: Never Used  . Alcohol Use: 0.6 oz/week    1 Cans of beer per week    Review of Systems   Cardiovascular: Positive for chest pain (resolved).  All other systems reviewed and are negative.     Allergies  Tiotropium bromide monohydrate; Antihistamines, diphenhydramine-type; Codeine; Morphine; Other; and Azithromycin  Home Medications   Prior to Admission medications   Medication Sig Start Date End Date Taking? Authorizing Provider  albuterol (PROAIR HFA) 108 (90 BASE) MCG/ACT inhaler Inhale 2 puffs into the lungs 4 (four) times daily.      Historical Provider, MD  albuterol (PROVENTIL) (2.5 MG/3ML) 0.083% nebulizer solution Take 3 mLs (2.5 mg total) by nebulization every 6 (six) hours as needed for wheezing or shortness of breath. 07/08/14   Deneise Lever, MD  albuterol (PROVENTIL) (2.5 MG/3ML) 0.083% nebulizer solution USE 1 VIAL IN NEBULIZER 4 TIMES A DAY AS NEEDED FOR WHEEZING 09/21/14   Deneise Lever, MD  Ascorbic Acid (VITAMIN C) 500 MG tablet Take 500 mg by mouth daily.      Historical Provider, MD  budesonide (PULMICORT) 0.5 MG/2ML nebulizer solution Take 2 mLs (0.5 mg total) by nebulization 2 (two) times daily. 07/21/14   Deneise Lever, MD  cefdinir (OMNICEF) 300 MG capsule Take 1 capsule (300 mg total) by mouth 2 (two) times daily. 06/29/14   Deneise Lever, MD  formoterol (FORADIL) 12 MCG capsule for inhaler Place  12 mcg into inhaler and inhale 2 (two) times daily.      Historical Provider, MD  furosemide (LASIX) 20 MG tablet Take 20 mg by mouth daily as needed (took once in last 2 months, for swelling).     Historical Provider, MD  glucosamine-chondroitin 500-400 MG tablet Take 1 tablet by mouth daily.      Historical Provider, MD  HYDROcodone-homatropine (HYCODAN) 5-1.5 MG/5ML syrup Take 5 mLs by mouth every 6 (six) hours as needed for cough. 10/27/13   Deneise Lever, MD  hydrocortisone (ANUSOL-HC) 2.5 % rectal cream Place 1 application rectally daily.    Historical Provider, MD  ibuprofen (ADVIL,MOTRIN) 200 MG tablet Take 600 mg by mouth every 6 (six) hours as  needed for pain.    Historical Provider, MD  mometasone (ASMANEX 60 METERED DOSES) 220 MCG/INH inhaler Inhale 1 puff into the lungs 2 (two) times daily.      Historical Provider, MD  Multiple Vitamin (MULTIVITAMIN) tablet Take 1 tablet by mouth daily.    Historical Provider, MD  omeprazole (PRILOSEC) 20 MG capsule Take 20 mg by mouth Daily.  12/06/10   Historical Provider, MD  potassium chloride (K-DUR,KLOR-CON) 10 MEQ tablet Take 10 mEq by mouth daily. Take once daily only when taking lasix 12/20/11   Deneise Lever, MD  Probiotic Product (PROBIOTIC DAILY PO) Take 1 tablet by mouth daily.     Historical Provider, MD  Tamsulosin HCl (FLOMAX) 0.4 MG CAPS Take 0.4 mg by mouth Twice daily.  12/06/10   Historical Provider, MD  theophylline (THEODUR) 100 MG 12 hr tablet TAKE 1 TABLET BY MOUTH TWICE DAILY AFTER FOOD 09/03/14   Deneise Lever, MD   BP 141/79 mmHg  Pulse 95  Temp(Src) 97.8 F (36.6 C) (Oral)  Resp 19  Ht 5' 6.5" (1.689 m)  Wt 160 lb (72.576 kg)  BMI 25.44 kg/m2  SpO2 94%   Physical Exam  Constitutional: He is oriented to person, place, and time. He appears well-developed and well-nourished. No distress.  HENT:  Head: Normocephalic and atraumatic.  Mouth/Throat: Oropharynx is clear and moist.  Eyes: Conjunctivae and EOM are normal. Pupils are equal, round, and reactive to light.  Neck: Normal range of motion. Neck supple.  Cardiovascular: Normal rate, regular rhythm and normal heart sounds.   Pulmonary/Chest: Effort normal and breath sounds normal. No respiratory distress. He has no wheezes.  Abdominal: Soft. Bowel sounds are normal. There is no tenderness. There is no guarding.  Musculoskeletal: Normal range of motion. He exhibits no edema.  No peripheral edema noted  Neurological: He is alert and oriented to person, place, and time.  AAOx3, answering questions appropriately; equal strength UE and LE bilaterally; CN grossly intact; moves all extremities appropriately without  ataxia; no focal neuro deficits or facial asymmetry appreciated  Skin: Skin is warm and dry. He is not diaphoretic.  Psychiatric: He has a normal mood and affect.  Nursing note and vitals reviewed.   ED Course  Procedures (including critical care time) Labs Review Labs Reviewed  COMPREHENSIVE METABOLIC PANEL - Abnormal; Notable for the following:    Glucose, Bld 118 (*)    Albumin 3.2 (*)    GFR calc non Af Amer 63 (*)    GFR calc Af Amer 73 (*)    All other components within normal limits  CBC  TROPONIN I  Randolm Idol, ED    Imaging Review Dg Chest 2 View  09/28/2014   CLINICAL DATA:  Acute chest  pain.  EXAM: CHEST  2 VIEW  COMPARISON:  June 29, 2014.  FINDINGS: The heart size and mediastinal contours are within normal limits. Hyperexpansion of the lungs is noted suggesting chronic obstructive pulmonary disease. No pneumothorax or pleural effusion is noted. Stable scarring is noted in right middle lobe. Stable interstitial markings are noted throughout both lungs most consistent with scarring. The visualized skeletal structures are unremarkable.  IMPRESSION: Findings consistent with chronic obstructive pulmonary disease. No acute cardiopulmonary abnormality seen.   Electronically Signed   By: Marijo Conception, M.D.   On: 09/28/2014 09:53     EKG Interpretation None      MDM   Final diagnoses:  Chest pain, unspecified chest pain type   74 y.o. M sent here from urgent care for further evaluation of EKG changes.  Episode of chest pain on Saturday lasting approx 15 minutes, however no recurrence since then.  VSS on arrival to ED.  EKG here with abnormal R-wave progression, however no acute t-wave changes.   EKG from urgent care was faxed over, there is evidence of t-wave inversions in V1 and V2.  Given differing EKG's will obtain basic labs, CXR.  Will consult cardiology.  12:37 PM Work up here reassuring, COPD noted on CXR.  Troponin negative.  Cardiology (Dr. Tamala Julian) has  evaluated patient and reviewed both EKG's.  Do not feel that changes on EKG are significant.  Given that patient has had no chest pain in >24 hours and negative work-up here, may be discharged home with close PCP FU.  This was discussed with patient who is in agreement with this.  Larene Pickett, PA-C 09/28/14 [place], MD 09/29/14 1314

## 2014-09-28 NOTE — Discharge Instructions (Signed)
Your work-up here today was normal.  Cardiology has signed on your EKG. Follow-up with your primary care physician. Return to the ED for new or worsening symptoms.

## 2014-09-28 NOTE — Consult Note (Signed)
Name: [name] is a 74 y.o. male Admit date: 09/28/2014 Referring Physician:  Hart Robinsons, M.D. Primary Physician:  Baird Lyons, M.D. Primary Cardiologist:  None  Reason for Consultation:  15 minutes of chest discomfort 48 hours ago  ASSESSMENT: 1. Precordial chest discomfort and the sensation that there was a pill stuck in his throat lasting approximately 15 minutes on Saturday evening. It resolved spontaneously and has not recurred. 2. Fatigue and weakness over the past 2 days of uncertain cause. This led the patient to an Urgent Care center this morning where an EKG was performed and he was sent to the emergency room 3. COPD with chronic dyspnea 4. History of lung cancer status post resection (adenocarcinoma) from right lung greater than 10 years ago 5. Prostate enlargement  PLAN:  1. Cardiac markers are negative, and EKG reveals no acute changes. No old tracings to compare. His EKG reveals a vertical axis but is otherwise unremarkable. 2. If recurrent chest pain, return to ER and/or report to his primary physician. 3. Aspirin 81 mg per day 4. Follow-up with primary care physician within the next 7 days. Earlier if recurrent chest pain   HPI: 74 year old gentleman with a history of adenocarcinoma of the lung. The lung cancer was successfully resected greater than 10 years ago. He also has a history of prostate enlargement, COPD,, and chronic dyspnea. While watching a basketball game on Saturday afternoon he began feeling as though a tablet was stuck in his throat. This was followed short in lead by discomfort in the chest that is poorly characterized. He drank some water, take an aspirin, and the discomfort in both places resolved in less than 15 minutes. He he has been active since that time. This had no recurrence of the discomfort. He denies changes in chronic dyspnea. He went to urgent care this morning because for the past 24-48 hours he has felt sluggish and fatigued. He thought  he might be coming down with a virus. An EKG was performed, and because of "abnormalities" they sent him to the emergency room. Review of the EKG reveals poor R-wave progression. No acute ST-T wave changes noted.  PMH:   Past Medical History  Diagnosis Date  . Prostate enlargement   . PNA (pneumonia)     as a child and 2004  . COPD (chronic obstructive pulmonary disease)   . Cancer     lung ca 2005  . Adenocarcinoma     RUL    PSH:   Past Surgical History  Procedure Laterality Date  . Lobectomy      RUL 2005 no adjuvant  . Cyst on wrist    . Tonsillectomy    . Transurethral resection of prostate (gyrus vaporization)    . Laparoscopic cholecystectomy    . Left inguinal herniorrhaphy.     Allergies:  Sulfamethoxazole-trimethoprim; Tiotropium bromide monohydrate; Antihistamines, diphenhydramine-type; Codeine; Morphine; Other; and Azithromycin Prior to Admit Meds:   (Not in a hospital admission) Fam HX:    Family History  Problem Relation Age of Onset  . Asthma Son    Social HX:    History   Social History  . Marital Status: Divorced    Spouse Name: N/A  . Number of Children: N/A  . Years of Education: N/A   Occupational History  . 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 assistant in [employer]    Social History Main Topics  . Smoking status: Former Smoker -- 2.50 packs/day for 40 years  Types: Cigarettes    Quit date: 09/09/2002  . Smokeless tobacco: Never Used  . Alcohol Use: 0.6 oz/week    1 Cans of beer per week  . Drug Use: No  . Sexual Activity: Not on file   Other Topics Concern  . Not on file   Social History Narrative     Review of Systems: No syncope, claudication, history of stroke, transient neurological symptom, or other CV related complaint. Occasional trace lower extremity edema as is present now does occur if he is up on his feet for prolonged periods.  Physical Exam: Blood pressure 125/64, pulse 81, temperature 97.8 F (36.6 C), temperature  source Oral, resp. rate 22, height 5' 6.5" (1.689 m), weight 160 lb (72.576 kg), SpO2 92 %. Weight change:   Slender with good skin color. No nail bed cyanosis. Neck veins not distended. No carotid bruits are heard. Lungs are clear to auscultation and percussion. Cardiac exam is normal. No rub or gallop is heard. Abdomen is soft. No masses or tenderness is noted. Extremities reveal no edema. Trace edema is noted. Neurological exam reveals no motor or sensory deficit. Labs: Lab Results  Component Value Date   WBC 10.2 09/28/2014   HGB 13.9 09/28/2014   HCT 41.8 09/28/2014   MCV 87.3 09/28/2014   PLT 220 09/28/2014    Recent Labs Lab 09/28/14 0928  NA 136  K 4.0  CL 100  CO2 30  BUN 10  CREATININE 1.13  CALCIUM 8.4  PROT 6.2  BILITOT 0.8  ALKPHOS 94  ALT 18  AST 35  GLUCOSE 118*   No results found for: PTT No results found for: INR, PROTIME Lab Results  Component Value Date   TROPONINI <0.03 09/28/2014    No results found for: CHOL No results found for: HDL No results found for: LDLCALC No results found for: TRIG No results found for: CHOLHDL No results found for: LDLDIRECT    Radiology:  Dg Chest 2 View  09/28/2014   CLINICAL DATA:  Acute chest pain.  EXAM: CHEST  2 VIEW  COMPARISON:  June 29, 2014.  FINDINGS: The heart size and mediastinal contours are within normal limits. Hyperexpansion of the lungs is noted suggesting chronic obstructive pulmonary disease. No pneumothorax or pleural effusion is noted. Stable scarring is noted in right middle lobe. Stable interstitial markings are noted throughout both lungs most consistent with scarring. The visualized skeletal structures are unremarkable.  IMPRESSION: Findings consistent with chronic obstructive pulmonary disease. No acute cardiopulmonary abnormality seen.   Electronically Signed   By: Marijo Conception, M.D.   On: 09/28/2014 09:53   Prior Chest CT scan, 2014: No coronary calcification is mentioned.  EKG:   Vertical to slight right axis deviation. Poor R-wave progression. No acute ST-T wave changes noted. No prior tracings available for comparison.   ECHOCARDIOGRAM: June 2012  Study Conclusions Left ventricle: The cavity size was normal. Wall thickness was normal. Systolic function was normal. The estimated ejection fraction was in the range of 55% to 60%. Wall motion was normal; there were no regional wall motion abnormalities. Left ventricular diastolic function parameters were normal.  [name] 09/28/2014 12:38 PM

## 2014-10-02 ENCOUNTER — Ambulatory Visit (INDEPENDENT_AMBULATORY_CARE_PROVIDER_SITE_OTHER): Payer: Medicare Other | Admitting: Internal Medicine

## 2014-10-02 ENCOUNTER — Encounter: Payer: Self-pay | Admitting: Internal Medicine

## 2014-10-02 VITALS — BP 130/60 | HR 104 | Ht 65.5 in | Wt 157.0 lb

## 2014-10-02 DIAGNOSIS — R0789 Other chest pain: Secondary | ICD-10-CM

## 2014-10-02 DIAGNOSIS — J441 Chronic obstructive pulmonary disease with (acute) exacerbation: Secondary | ICD-10-CM

## 2014-10-02 DIAGNOSIS — C3490 Malignant neoplasm of unspecified part of unspecified bronchus or lung: Secondary | ICD-10-CM

## 2014-10-02 MED ORDER — PREDNISONE 10 MG PO TABS: ORAL_TABLET | ORAL | Status: DC

## 2014-10-02 NOTE — Progress Notes (Signed)
Patient ID: [name], male    DOB: [birth year], 74 y.o.   MRN: [id number]  HPI 12/15/10- 48 yoM former smoker, followed for COPD complicated by hx RUL AdenoCa/ lobectomy 2005. Last here 11/01/10. Note reviewed. He still feels more short of breath than is comfortable- easily fatigued with exertion and doesn't understand why with good oxygen levels.  Went down to [employer] to dance - not any harder than here. He came back with a cough and was given a cough pill. Advair sample seemed better than spiriva, but is not covered by [place] and he has lost job. Feet still swell. Denies chest pain, palpitation.  PFT today- Severe emphysema. FEV1/FVC 0.39, DLCO 0.36, insignif resp to bronchodilator.  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 Still goes dancing regularly. Cardiologist told him heart was okay and that shortness of breath was due to his lungs. He is now using Asmanex 220, Foradil, as provided by the [place]. There has been no change in his ankle edema which is not dependent. He has no history of DVT. Dr Arlyce Dice did a six-month followup chest x-ray, now 8 years postoperatively after his right upper lobectomy for cancer. The patient wants a chest x-ray at least once a year by me for ongoing long-term surveillance. We discussed chest x-ray versus CT scan. Chest x-ray: 04/12/2011-stable right middle lobe nodule and postoperative scarring. Images were reviewed with him.  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 Treated with antibiotics and prednisone tapers. Has 3 more days on current taper. Head chest x-ray February 16 showing chronic right middle lobe scarring, pectus excavatum. His exercise was dancing and he hasn't been  able to dance because of shortness of breath. Blames lack of exercise and repeated prednisone tapers for 12 pound weight gain and admits he is eating more. We discussed steroid side effects. Wakes with malaise in the morning, weak, scant cough, nasal congestion. Denies sore throat, fever, purulent sputum, palpitation, chest pain. Can't afford pulmonary rehabilitation not covered by Medicare. Asks cheap alternative for Pramosone steroid cream for rectal itch.   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 Increased shortness of breath with exertion. Stays congested with cough and wheeze. He thinks weather is the problem. [facility] gave oxygen 2 L per minute. He has only used it for a total of about 10 minutes since he has had it. He has an oximeter. We reviewed indications for oxygen and assured him it would not make him more dependent. He notices leg edema but is not using his Lasix because he considers diuresis "a nuisance". CXR 04/12/11 reviewed with him IMPRESSION:  Stable nodular area consistent with postoperative scarring in the  right middle lobe. COPD.  Original Report Authenticated By: [name], M.D.    03/11/12- 89 yoM former smoker, followed for COPD complicated by hx RUL AdenoCa/ lobectomy Consistent cough for about 1 month-"crashed on Tramadol"unable to tell big difference with Arcapta He notices gradual weight loss and he is pleased because he is reaching his desired weight although it isn't clear why he is losing the weight. Cough has been more productive-  he blames "overwork in hot weather". His primary physician saw him July 27 and gave prednisone and antibiotic. Chest x-ray showed no pneumonia. He dislikes Delsym. Tramadol helped cough but made him dizzy. Nebulizer sometimes helps. 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 Still having SOB with exertion-normally this doesnt happen; took breathing tx and breathing pills this morning.  CXR- 03/25/12- at Dr Johny Shears- NAD.  COPD Assessment Test (CAT) score 7/ 40 Home oxygen from a [place] [employer] company at 2 L. He doesn't use it much. He says he sleeps up and down between sofa and bad. The noise of his oxygen machine bothers him so he tries to limit use. Dyspnea on exertion from the car into his office.  12/11/12- 68 yoM former smoker, followed for COPD/ Gold 3 complicated by hx RUL AdenoCa/ lobectomy      Accompanied today by Dr [name]- friend Follows for : pt states having increase sob, wheezing,  with activity . pt states also has a croup sounding cough.,chest congestion. Was treated with Celebrex for prostatitis. Had increased shortness of breath x 3 weeks, so he quit Celebrex 10 days ago. Productive cough with clear mucus x1 week. Sore right chest on waking for the past several mornings but no tussive pain and no adenopathy. Legs have been tender and feeling more swollen. No history of DVT - self or family.  Does better with albuterol by nebulizer than with metered inhalers.  04/02/13- 75 yoM former smoker, followed for COPD/ Gold 3, lung nodules, complicated by hx RUL AdenoCa/ lobectomy    PET scan was in nonmalignant range. He realizes nodules may be very slow-growing adenocarcinoma  and will keep appointment October 13 for followup chest CT    He has been arguing with the [place] about payment of their bill for oximetry. He has wanted to keep home oxygen but did not desaturate enough on a limited walk test at the [place]. He did qualify on repeat and has oxygen at 2 L used as needed. Some productive cough. He feels well and can dance long enough to complete one song but not two. CT chest 12/17/12 IMPRESSION:  1. No evidence for  acute pulmonary embolus.  2. Suspicious, bilateral upper lobe nodules. Cannot rule out  small pulmonary neoplasms. Advise further evaluation with PET CT.  Original Report Authenticated By: Kerby Moors, M.D.  PET 12/30/12 IMPRESSION:  1. Bilateral upper lobe nodular densities appear less conspicuous  on today's study. There is no malignant range FDG uptake  associated these structures which are favored to be the sequela of  inflammation or infection. However, because low grade pulmonary  adenocarcinoma is may exhibit low level FDG uptake I would advise  follow-up imaging to confirm continued resolution/ stability. The  next follow-up examination should be obtained in 3 months. At this  time a diagnostic CT of the chest should be obtained.  Original Report Authenticated By: Kerby Moors, M.D.  07/29/13-  72 yoM former smoker, followed for COPD/ Gold 3, lung nodules, complicated by hx RUL AdenoCa/ lobectomy    ACUTE VISIT: cough x 1 week-productive-no color but thick; Increased SOB; wheezing. Denies any fever or chills. 10 days of cough, productive white, on Z pak from urgent care. Sore throat resolved.. Started lasix- BNP was 214 on 07/26/13.  Was given  pred taper 12/22 which has not helped.  CT 05/12/13 IMPRESSION:  1. Focal masslike opacity seen on the prior study, 1 in the  posterior right upper lobe in the other in the left upper lobe have  mostly resolved. The right upper lobe lesion is now reflected by  minor reticular scarring and anterior tenting of the obliques  fissure. The left upper lesion is reflected by an area of mild  reticular scarring. There is no residual mass or masslike opacity.  No additional followup of these abnormalities is indicated. These  areas were due to infection or inflammation that has subsequently  resolved.  2. 4 mm left lower lobe performed base nodule is stable.  3. Advanced changes of emphysema and other areas of mild reticular  lung scarring are  also stable. No new lung abnormalities.  Electronically Signed  By: Lajean Manes M.D.  On: 05/12/2013 14:05  10/27/13-72 yoM former smoker, followed for COPD/ Gold 3, lung nodules, complicated by hx RUL AdenoCa/ lobectomy    FOLLOWS FOR: Recently in hospital for COPD exacerbation at [facility], pneumonia left lung.. Pt states he contines to have more SOB than usual. Discharged on Levaquin ending March 5. Using oxygen 3 L/[facility] Cough is less productive and sputum is clear/scant. Pleuritic left rib pain is gone. If not portable oxygen too heavy but desaturates with exertion to 88% on room air. We discussed concentrator. CXR 07/29/13 IMPRESSION:  1. Lung hyperexpansion and bronchitic change without acute  cardiopulmonary disease.  2. Stable postsurgical change of the anterior aspect of the right  middle lobe.  Electronically Signed  By: Sandi Mariscal M.D.  On: 07/29/2013 16:01   02/23/14- 70 yoM former smoker, followed for COPD/ Gold 3, lung nodules, complicated by hx RUL AdenoCa/ lobectomy  FOLLOWS FOR:  Reports having some chest congestion.  Denies any cough or wheezing Not using oxygen 3 L/[facility] [employer]. Saturations at home running 96-97% on room air To 3 days of increased chest congestion with white phlegm, not an infection Still using foradil and theophylline, nebulizer twice daily Walking 1.25 miles daily and portable oxygen was too heavy. He paces himself. Oxygen did help when he went dancing because he could go to the side and use it between dances.  06/29/14- 9 yoM former smoker, followed for COPD/ Gold 3, lung nodules, complicated by hx RUL AdenoCa/ lobectomy  FOLLOWS FOR: Chest congestion; coughing up clear phelgm for 3 weeks now-has been using Mucinex to help.  10/02/14- 59 yoM former smoker, followed for COPD/ Gold 3, lung nodules, complicated by hx RUL AdenoCa/ lobectomy, GERD FOLLOWS FOR: Pt reports cough with clear mucus and sob with exertion. He is on 02 2 Liters/  [facility] [place]) as needed. He says his oxygen levels keep dropping. Pt had a cxr on 09/28/14. He has not felt well for 3 months, describing repeated episodes of bronchitis treated by his primary physician with antibiotics and prednisone tapers. 5 days ago he had an episode of chest pain for which she took an aspirin and was referred to ER where he was told EKG was "okay". Aware of GERD His primary physician increased his theophylline because repeated low levels around 3.2. He is now taking 300 mg twice daily. We discussed status is a "rapid metabolizer". We considered Daliresp but he did not do well with that in the past. Maintenance Zithromax considered but it caused rash. He does not tolerate narcotics as a cough syrup option. CXR 09/28/14 IMPRESSION: Findings consistent with chronic obstructive pulmonary  disease. No acute cardiopulmonary abnormality seen. Electronically Signed  By: Marijo Conception, M.D.  On: 09/28/2014 09:53  Review of Systems-see HPI Constitutional:   No-  weight loss, No-night sweats, fevers, chills, fatigue, lassitude. HEENT:   No-  headaches, difficulty swallowing, tooth/dental problems, sore throat,       No-  sneezing, itching, ear ache, nasal congestion, post nasal drip,  CV:  No- chest pain, no-orthopnea, PND, + swelling in lower extremities, no-dizziness, palpitations Resp: + shortness of breath with exertion or at rest.                +  productive cough,  + non-productive cough,  No- coughing up of blood.              No-   change in color of mucus.  No- wheezing.   Skin: No-   rash or lesions. GI:  No-   heartburn, indigestion, abdominal pain, nausea, vomiting,  GU:  MS:  No-   joint pain or swelling.   Neuro-     nothing unusual Psych:  No- change in mood or affect. No depression or anxiety.  No memory loss.    Objective:   Physical Exam  General- Alert, Oriented, Affect-appropriate, Distress- none acute Skin- rash-none, lesions- none,  excoriation- none Lymphadenopathy- none Head- atraumatic            Eyes- Gross vision intact, PERRLA, conjunctivae clear secretions            Ears- Hearing, canals-normal            Nose- Clear, no-Septal dev, mucus, polyps, erosion, perforation             Throat- Mallampati II , mucosa clear , drainage- none, tonsils- atrophic Neck- flexible , trachea midline, no stridor , thyroid nl, carotid no bruit Chest - symmetrical excursion , unlabored           Heart/CV- RRR  , no murmur , no gallop  , no rub, nl s1 s2                           - JVD- none , edema- trace, stasis changes- none, varices- none           Lung-   Cough-none,Distant/clear, wheeze-none, unlabored dullness-none, rub- none. Came on room air sat 88% at rest           Chest wall-  Abd-  Br/ Gen/ Rectal- Not done, not indicated Extrem- cyanosis- none, clubbing, none, atrophy- none, strength- nl, heavy legs,  Neuro- grossly intact to observation

## 2014-10-02 NOTE — Assessment & Plan Note (Signed)
No evidence of recurrence to date

## 2014-10-02 NOTE — Assessment & Plan Note (Signed)
With known GERD, suspect his chest pain was reflux related since apparently cardiac event was ruled out at ER. I'm concerned about pushing theophylline in this situation and recommend he not go higher than 300 mg twice daily with doses taken after food.

## 2014-10-02 NOTE — Patient Instructions (Addendum)
Prednisone 10 mg, take 2 daily x 7 days, then 1 daily script sent  Try Delsym otc cough syrup, perhaps alternating with tramadol if you have some left  Set your O2 2-4L/m as needed to hold sat 88-92 range most of the time  Ok to continue the theophylline- take it after meals with food in your stomach             300 mg twice daily. I wouldn't go higher regardless of the blood level.

## 2014-10-02 NOTE — Assessment & Plan Note (Signed)
Sustained exacerbation this winter, probably a series of viral respiratory infections. With limited options, we are going to try low-dose prednisone maintenance long enough to hopefully give him some stability. [facility] He is encouraged to use it continuously. Try Delsym for cough

## 2014-10-09 ENCOUNTER — Telehealth: Payer: Self-pay | Admitting: Internal Medicine

## 2014-10-09 MED ORDER — PREDNISONE 10 MG PO TABS: ORAL_TABLET | ORAL | Status: DC

## 2014-10-09 NOTE — Telephone Encounter (Signed)
Offer prednisone 10 mg  6x 2 days, 4 X 2 DAYS, 3 X 2 DAYS, 2 X 2 DAYS, 1 X 2 DAYS    # 32

## 2014-10-09 NOTE — Telephone Encounter (Signed)
Pt aware of recs. RX sent in. Nothing further needed 

## 2014-10-09 NOTE — Telephone Encounter (Signed)
Spoke with pt, states he was given prednisone last Friday at his visit, but his prednisone isn't working.  Pt states his "prednisone is too low".  Pt has been on 02 all week.  Pt has been taking 20mg  prednisone since last Friday, starts 10mg  today.  Pt c/o 02 dropping with any exertion.  Prod cough with clear mucus, sob.  Denies fever, sinus congestion.  [facility] [address].   CY please advise.  Thanks!  Allergies  Allergen Reactions  . Sulfamethoxazole-Trimethoprim Shortness Of Breath  . Tiotropium Bromide Monohydrate Other (See Comments)    Caused prostate issues, could not void.    Marland Kitchen Antihistamines, Diphenhydramine-Type Other (See Comments)    Keeps patient awake  . Codeine Itching    Nightmares, feels itchy inside-per patient  . Morphine Other (See Comments)    Unknown, possible itchy feelings inside, nightmares  . Other Itching    Surgical Tape  . Azithromycin Rash   Current Outpatient Prescriptions on File Prior to Visit  Medication Sig Dispense Refill  . albuterol (PROAIR HFA) 108 (90 BASE) MCG/ACT inhaler Inhale 2 puffs into the lungs 4 (four) times daily.      Marland Kitchen albuterol (PROVENTIL) (2.5 MG/3ML) 0.083% nebulizer solution USE 1 VIAL IN NEBULIZER 4 TIMES A DAY AS NEEDED FOR WHEEZING 1080 mL 3  . Ascorbic Acid (VITAMIN C) 500 MG tablet Take 500 mg by mouth daily.      . budesonide (PULMICORT) 0.5 MG/2ML nebulizer solution Take 2 mLs (0.5 mg total) by nebulization 2 (two) times daily. 360 mL 3  . formoterol (FORADIL) 12 MCG capsule for inhaler Place 12 mcg into inhaler and inhale 2 (two) times daily.      Marland Kitchen glucosamine-chondroitin 500-400 MG tablet Take 1 tablet by mouth daily.      . hydrocortisone (ANUSOL-HC) 2.5 % rectal cream Place 1 application rectally daily.    Marland Kitchen ibuprofen (ADVIL,MOTRIN) 200 MG tablet Take 600 mg by mouth every 6 (six) hours as needed for pain.    . mometasone (ASMANEX 60 METERED DOSES) 220 MCG/INH inhaler Inhale 1 puff into the lungs 2 (two)  times daily.      . Multiple Vitamin (MULTIVITAMIN) tablet Take 1 tablet by mouth daily.    Marland Kitchen omeprazole (PRILOSEC) 20 MG capsule Take 20 mg by mouth Daily.     . predniSONE (DELTASONE) 10 MG tablet 2 daily x 7 days, then one daily 50 tablet 1  . Tamsulosin HCl (FLOMAX) 0.4 MG CAPS Take 0.4 mg by mouth Twice daily.     . theophylline (THEODUR) 100 MG 12 hr tablet TAKE 1 TABLET BY MOUTH TWICE DAILY AFTER FOOD (Patient taking differently: TAKE 2 TABLETs BY MOUTH TWICE DAILY AFTER FOOD) 180 tablet 1   No current facility-administered medications on file prior to visit.

## 2014-10-28 ENCOUNTER — Ambulatory Visit: Payer: Medicare Other | Admitting: Internal Medicine

## 2014-11-13 ENCOUNTER — Encounter: Payer: Self-pay | Admitting: Internal Medicine

## 2014-11-13 ENCOUNTER — Ambulatory Visit (INDEPENDENT_AMBULATORY_CARE_PROVIDER_SITE_OTHER): Payer: Medicare Other | Admitting: Internal Medicine

## 2014-11-13 VITALS — BP 114/62 | HR 86 | Ht 65.5 in | Wt 162.2 lb

## 2014-11-13 DIAGNOSIS — J449 Chronic obstructive pulmonary disease, unspecified: Secondary | ICD-10-CM

## 2014-11-13 MED ORDER — ROFLUMILAST 500 MCG PO TABS: 500.0000 ug | ORAL_TABLET | Freq: Every day | ORAL | Status: DC

## 2014-11-13 MED ORDER — PREDNISONE 5 MG PO TABS: 5.0000 mg | ORAL_TABLET | Freq: Every day | ORAL | Status: DC

## 2014-11-13 NOTE — Patient Instructions (Signed)
Script sent for low dose prednisone-    See if it helps prostate and breathing  Script sent for Daliresp to try to reduce frequency of acute bronchitis spells        Start with 1/2 tab once daily with food. Gradually- 2 or 3 days at a time- increase as tolerated to 1 tab twice daily with food  We have stopped theophyline

## 2014-11-13 NOTE — Assessment & Plan Note (Signed)
COPD, oxygen dependent at night and with exacerbations. He strongly wants to improve his activity tolerance so he can dance again. We discussed Daliresp for potential reduced numbers of bronchitis exacerbations. I doubt that it is going to improve his oxygenation or exercise tolerance when theophylline did not. If low-dose prednisone improved prostate symptoms enough that he could tolerate an anticholinergic/Spiriva that would be a big help, but unlikely. Plan-prescription Daliresp starting with low-dose. Stopped theophylline. Pulmonary rehabilitation encouraged through [place] system. Continue to monitor oxygen saturation at home from time to time.

## 2014-11-13 NOTE — Progress Notes (Signed)
Patient ID: [name], male    DOB: 1940-08-21, 74 y.o.   MRN: [id number]  HPI 12/15/10- 28 yoM former smoker, followed for COPD complicated by hx RUL AdenoCa/ lobectomy 2005. Last here 11/01/10. Note reviewed. He still feels more short of breath than is comfortable- easily fatigued with exertion and doesn't understand why with good oxygen levels.  Went down to [employer] to dance - not any harder than here. He came back with a cough and was given a cough pill. Advair sample seemed better than spiriva, but is not covered by [place] and he has lost job. Feet still swell. Denies chest pain, palpitation.  PFT today- Severe emphysema. FEV1/FVC 0.39, DLCO 0.36, insignif resp to bronchodilator.  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 Still goes dancing regularly. Cardiologist told him heart was okay and that shortness of breath was due to his lungs. He is now using Asmanex 220, Foradil, as provided by the [place]. There has been no change in his ankle edema which is not dependent. He has no history of DVT. Dr Arlyce Dice did a six-month followup chest x-ray, now 8 years postoperatively after his right upper lobectomy for cancer. The patient wants a chest x-ray at least once a year by me for ongoing long-term surveillance. We discussed chest x-ray versus CT scan. Chest x-ray: 04/12/2011-stable right middle lobe nodule and postoperative scarring. Images were reviewed with him.  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 Treated with antibiotics and prednisone tapers. Has 3 more days on current taper. Head chest x-ray February 16 showing chronic right middle lobe scarring, pectus excavatum. His exercise was dancing and he hasn't been  able to dance because of shortness of breath. Blames lack of exercise and repeated prednisone tapers for 12 pound weight gain and admits he is eating more. We discussed steroid side effects. Wakes with malaise in the morning, weak, scant cough, nasal congestion. Denies sore throat, fever, purulent sputum, palpitation, chest pain. Can't afford pulmonary rehabilitation not covered by Medicare. Asks cheap alternative for Pramosone steroid cream for rectal itch.   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 Increased shortness of breath with exertion. Stays congested with cough and wheeze. He thinks weather is the problem. [facility] gave oxygen 2 L per minute. He has only used it for a total of about 10 minutes since he has had it. He has an oximeter. We reviewed indications for oxygen and assured him it would not make him more dependent. He notices leg edema but is not using his Lasix because he considers diuresis "a nuisance". CXR 04/12/11 reviewed with him IMPRESSION:  Stable nodular area consistent with postoperative scarring in the  right middle lobe. COPD.  Original Report Authenticated By: [name], M.D.    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 Cough has been more productive-  he blames "overwork in hot weather". His primary physician saw him July 27 and gave prednisone and antibiotic. Chest x-ray showed no pneumonia. He dislikes Delsym. Tramadol helped cough but made him dizzy. Nebulizer sometimes helps. 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 Still having SOB with exertion-normally this doesnt happen; took breathing tx and breathing pills this morning.  CXR- 03/25/12- at Dr Johny Shears- NAD.  COPD Assessment Test (CAT) score 7/ 40 Home oxygen from a [place] [employer] company at 2 L. He doesn't use it much. He says he sleeps up and down between sofa and bad. The noise of his oxygen machine bothers him so he tries to limit use. Dyspnea on exertion from the car into his office.  12/11/12- 74 yoM former smoker, followed for COPD/ Gold 3 complicated by hx RUL AdenoCa/ lobectomy      Accompanied today by Dr [name]- friend Follows for : pt states having increase sob, wheezing,  with activity . pt states also has a croup sounding cough.,chest congestion. Was treated with Celebrex for prostatitis. Had increased shortness of breath x 3 weeks, so he quit Celebrex 10 days ago. Productive cough with clear mucus x1 week. Sore right chest on waking for the past several mornings but no tussive pain and no adenopathy. Legs have been tender and feeling more swollen. No history of DVT - self or family.  Does better with albuterol by nebulizer than with metered inhalers.  04/02/13- 74 yoM former smoker, followed for COPD/ Gold 3, lung nodules, complicated by hx RUL AdenoCa/ lobectomy    PET scan was in nonmalignant range. He realizes nodules may be very slow-growing adenocarcinoma  and will keep appointment October 13 for followup chest CT    He has been arguing with the [place] about payment of their bill for oximetry. He has wanted to keep home oxygen but did not desaturate enough on a limited walk test at the [place]. He did qualify on repeat and has oxygen at 2 L used as needed. Some productive cough. He feels well and can dance long enough to complete one song but not two. CT chest 12/17/12 IMPRESSION:  1. No evidence for  acute pulmonary embolus.  2. Suspicious, bilateral upper lobe nodules. Cannot rule out  small pulmonary neoplasms. Advise further evaluation with PET CT.  Original Report Authenticated By: Kerby Moors, M.D.  PET 12/30/12 IMPRESSION:  1. Bilateral upper lobe nodular densities appear less conspicuous  on today's study. There is no malignant range FDG uptake  associated these structures which are favored to be the sequela of  inflammation or infection. However, because low grade pulmonary  adenocarcinoma is may exhibit low level FDG uptake I would advise  follow-up imaging to confirm continued resolution/ stability. The  next follow-up examination should be obtained in 3 months. At this  time a diagnostic CT of the chest should be obtained.  Original Report Authenticated By: Kerby Moors, M.D.  07/29/13-  72 yoM former smoker, followed for COPD/ Gold 3, lung nodules, complicated by hx RUL AdenoCa/ lobectomy    ACUTE VISIT: cough x 1 week-productive-no color but thick; Increased SOB; wheezing. Denies any fever or chills. 10 days of cough, productive white, on Z pak from urgent care. Sore throat resolved.. Started lasix- BNP was 214 on 07/26/13.  Was given  pred taper 12/22 which has not helped.  CT 05/12/13 IMPRESSION:  1. Focal masslike opacity seen on the prior study, 1 in the  posterior right upper lobe in the other in the left upper lobe have  mostly resolved. The right upper lobe lesion is now reflected by  minor reticular scarring and anterior tenting of the obliques  fissure. The left upper lesion is reflected by an area of mild  reticular scarring. There is no residual mass or masslike opacity.  No additional followup of these abnormalities is indicated. These  areas were due to infection or inflammation that has subsequently  resolved.  2. 4 mm left lower lobe performed base nodule is stable.  3. Advanced changes of emphysema and other areas of mild reticular  lung scarring are  also stable. No new lung abnormalities.  Electronically Signed  By: Lajean Manes M.D.  On: 05/12/2013 14:05  10/27/13-72 yoM former smoker, followed for COPD/ Gold 3, lung nodules, complicated by hx RUL AdenoCa/ lobectomy    FOLLOWS FOR: Recently in hospital for COPD exacerbation at [facility], pneumonia left lung.. Pt states he contines to have more SOB than usual. Discharged on Levaquin ending March 5. Using oxygen 3 L/[employer] Cough is less productive and sputum is clear/scant. Pleuritic left rib pain is gone. If not portable oxygen too heavy but desaturates with exertion to 88% on room air. We discussed concentrator. CXR 07/29/13 IMPRESSION:  1. Lung hyperexpansion and bronchitic change without acute  cardiopulmonary disease.  2. Stable postsurgical change of the anterior aspect of the right  middle lobe.  Electronically Signed  By: Sandi Mariscal M.D.  On: 07/29/2013 16:01   02/23/14- 49 yoM former smoker, followed for COPD/ Gold 3, lung nodules, complicated by hx RUL AdenoCa/ lobectomy  FOLLOWS FOR:  Reports having some chest congestion.  Denies any cough or wheezing Not using oxygen 3 L/[employer]. Saturations at home running 96-97% on room air To 3 days of increased chest congestion with white phlegm, not an infection Still using foradil and theophylline, nebulizer twice daily Walking 1.25 miles daily and portable oxygen was too heavy. He paces himself. Oxygen did help when he went dancing because he could go to the side and use it between dances.  06/29/14- 63 yoM former smoker, followed for COPD/ Gold 3, lung nodules, complicated by hx RUL AdenoCa/ lobectomy  FOLLOWS FOR: Chest congestion; coughing up clear phelgm for 3 weeks now-has been using Mucinex to help.  10/02/14- 62 yoM former smoker, followed for COPD/ Gold 3, lung nodules, complicated by hx RUL AdenoCa/ lobectomy, GERD FOLLOWS FOR: Pt reports cough with clear mucus and sob with exertion. He is on 02 2 Liters/  [facility]) as needed. He says his oxygen levels keep dropping. Pt had a cxr on 09/28/14. He has not felt well for 3 months, describing repeated episodes of bronchitis treated by his primary physician with antibiotics and prednisone tapers. 5 days ago he had an episode of chest pain for which he took an aspirin and was referred to ER where he was told EKG was "okay". Aware of GERD His primary physician increased his theophylline because repeated low levels around 3.2. He is now taking 300 mg twice daily. We discussed status is a "rapid metabolizer". We considered Daliresp but he did not do well with that in the past. Maintenance Zithromax considered but it caused rash. He does not tolerate narcotics as a cough syrup option. CXR 09/28/14 IMPRESSION: Findings consistent with chronic obstructive pulmonary  disease. No acute cardiopulmonary abnormality seen. Electronically Signed  By: Marijo Conception, M.D.  On: 09/28/2014 09:53  11/13/14- 40 yoM former smoker, followed for COPD/ Gold 3, lung nodules, complicated by hx RUL AdenoCa/ lobectomy, GERD FOLLOWS FOR: Pt states he would like to get off O2 if possible; [facility] not [facility] him for this. Pt states his stamnia is still low.  Oxygen saturation is good when sitting quietly but falls rapidly into the 80% range on room air with any exertion. He is hoping to change this so that he can go dancing again. Has had 2 "flares" with bronchitis in this calendar year so far. Stopped theophylline with no apparent difference. Interested in trying Daliresp again. Enlarged prostate is significant problem addressed by urology. Unfortunately continues bad enough that he has to avoid Spiriva. Prednisone improves his prostate function, so he asks about trying maintenance prednisone for this and his breathing. Steroid talk done. To get pulmonary rehabilitation through the [place].  Review of Systems-see HPI Constitutional:   No-  weight loss,  No-night sweats, fevers, chills, fatigue, lassitude. HEENT:   No-  headaches, difficulty swallowing, tooth/dental problems, sore throat,       No-  sneezing, itching, ear ache, nasal congestion, post nasal drip,  CV:  No- chest pain, no-orthopnea, PND, + swelling in lower extremities, no-dizziness, palpitations Resp: + shortness of breath with exertion or at rest.                No- productive cough,  + non-productive cough,  No- coughing up of blood.              No-   change in color of mucus.  No- wheezing.   Skin: No-   rash or lesions. GI:  No-   heartburn, indigestion, abdominal pain, nausea, vomiting,  GU: Dysuria, frequency, retention/ BPH MS:  No-   joint pain or swelling.   Neuro-     nothing unusual Psych:  No- change in mood or affect. No depression or anxiety.  No memory loss.    Objective:   Physical Exam  General- Alert, Oriented, Affect-appropriate, Distress- none acute Skin- rash-none, lesions- none, excoriation- none Lymphadenopathy- none Head- atraumatic            Eyes- Gross vision intact, PERRLA, conjunctivae clear secretions            Ears- Hearing, canals-normal            Nose- Clear, no-Septal dev, mucus, polyps, erosion, perforation             Throat- Mallampati II , mucosa clear , drainage- none, tonsils- atrophic Neck- flexible , trachea midline, no stridor , thyroid nl, carotid no bruit Chest - symmetrical excursion , unlabored           Heart/CV- RRR  , no murmur , no gallop  , no rub, nl s1 s2                           - JVD- none , edema- trace, stasis changes- none, varices- none           Lung-   Cough-none,Distant/clear, wheeze-none, unlabored dullness-none, rub- none. Came on room air sat 94% at rest           Chest wall-  Abd-  Br/ Gen/ Rectal- Not done, not indicated Extrem- cyanosis- none, clubbing, none, atrophy- none, strength- nl, heavy legs,  Neuro- grossly intact to observation

## 2014-11-17 ENCOUNTER — Telehealth: Payer: Self-pay | Admitting: Internal Medicine

## 2014-11-17 NOTE — Telephone Encounter (Signed)
PA started in Covermymeds for Daliresp 561mg Key: [id number]

## 2014-11-20 ENCOUNTER — Telehealth: Payer: Self-pay | Admitting: Internal Medicine

## 2014-11-20 DIAGNOSIS — J432 Centrilobular emphysema: Secondary | ICD-10-CM

## 2014-11-20 NOTE — Telephone Encounter (Signed)
PA was submitted on 11/19/14 for Daliresp 500 mg. Please allow up to 5 business days for decision. Key: [id number]

## 2014-11-20 NOTE — Telephone Encounter (Signed)
Yes- please send [facility] order for pulmonary rehab for dx COPD with centrilobular emphysema as requested. They may need copy of PFT.  Also- please ask him specifically where he likes to go to dance. Another patient was asking about it.

## 2014-11-20 NOTE — Telephone Encounter (Signed)
Called and spoke to pt. Pt stated after OV with CY on 4/15 pulm rehab was discussed and encouraged through the [place]. Pt stated his PCP at the [place] (Dr. Arletha Grippe) needs a pulm rehab order faxed from [place] so the [place] can pay for his rehab.   CY please advise if you are ok with this. Thanks.

## 2014-11-20 NOTE — Telephone Encounter (Signed)
Called and spoke to pt. Informed pt of the ok for the pulm rehab order. Order placed and faxed with last OV note to [phone] per pt. Pt verbalized understanding and denied any further questions or concerns at this time.

## 2014-12-02 ENCOUNTER — Telehealth: Payer: Self-pay | Admitting: *Deleted

## 2014-12-02 NOTE — Telephone Encounter (Signed)
Called.

## 2014-12-02 NOTE — Telephone Encounter (Signed)
Called patient to see if he had heard anything from his ins/pharmacy about Daliresp. Pt states he received a call stating it had been denied. Patient says he does not care if it is approved or not at this point. He says he had a difficult time with medication when he first tried it. I told him that we were working on getting it approved. He says he is not sure if he will be able to afford it.

## 2014-12-02 NOTE — Telephone Encounter (Signed)
PA resubmitted with last office note. Marked please expedite. Awaiting decision.

## 2014-12-04 ENCOUNTER — Telehealth: Payer: Self-pay | Admitting: *Deleted

## 2014-12-04 NOTE — Telephone Encounter (Signed)
Spoke to [employer] @ [employer]. Appeal was approved for Daliresp. PA# [id number] PHARM/PT NOTIFIED.

## 2014-12-04 NOTE — Telephone Encounter (Signed)
Daliresp will cost patient $24 after insurance pays. Patient says he can't afford this. I told him message would be given to Dr. Annamaria Boots.

## 2014-12-07 NOTE — Telephone Encounter (Signed)
Patient says that he tried the sample of the [place] and he said that it didn't really do much for him anyway.  He said that he is okay with not taking the Daliresp.  Nothing further needed.

## 2014-12-07 NOTE — Telephone Encounter (Signed)
I don't have a substitute. It would be best to stop it .

## 2014-12-25 ENCOUNTER — Telehealth: Payer: Self-pay | Admitting: Internal Medicine

## 2014-12-25 NOTE — Telephone Encounter (Signed)
Patient says that he was taking Spiriva, but it caused prostate problems.  He was taking Foradil, [facility] says this has been discontinued.  [facility] is sending him Olodaterol 2.'5mg'$  to take in place of Foradil.  He spoke with them on the phone and they told him that they were sending him Olodaterol 2.'5mg'$ , he received a letter from [place] [facility] saying they were sending him Stiolto Respimat.  He also was told by the [facility] that this medication is a combination of Spiriva and Foradil.  Patient is concerned about this because he says that Spiriva caused him prostate problems and he worries that this medication will cause problems with his prostate.    CY - please advise.  Current Outpatient Prescriptions on File Prior to Visit  Medication Sig Dispense Refill  . albuterol (PROAIR HFA) 108 (90 BASE) MCG/ACT inhaler Inhale 2 puffs into the lungs 4 (four) times daily.      Marland Kitchen albuterol (PROVENTIL) (2.5 MG/3ML) 0.083% nebulizer solution USE 1 VIAL IN NEBULIZER 4 TIMES A DAY AS NEEDED FOR WHEEZING 1080 mL 3  . Ascorbic Acid (VITAMIN C) 500 MG tablet Take 500 mg by mouth daily.      . budesonide (PULMICORT) 0.5 MG/2ML nebulizer solution Take 2 mLs (0.5 mg total) by nebulization 2 (two) times daily. 360 mL 3  . formoterol (FORADIL) 12 MCG capsule for inhaler Place 12 mcg into inhaler and inhale 2 (two) times daily.      Marland Kitchen glucosamine-chondroitin 500-400 MG tablet Take 1 tablet by mouth daily.      . hydrocortisone (ANUSOL-HC) 2.5 % rectal cream Place 1 application rectally daily.    Marland Kitchen ibuprofen (ADVIL,MOTRIN) 200 MG tablet Take 600 mg by mouth every 6 (six) hours as needed for pain.    . mometasone (ASMANEX 60 METERED DOSES) 220 MCG/INH inhaler Inhale 1 puff into the lungs 2 (two) times daily.      . Multiple Vitamin (MULTIVITAMIN) tablet Take 1 tablet by mouth daily.    Marland Kitchen omeprazole (PRILOSEC) 20 MG capsule Take 20 mg by mouth Daily.     . predniSONE (DELTASONE) 5 MG tablet Take 1 tablet (5 mg total) by mouth daily with  breakfast. 31 tablet 3  . roflumilast (DALIRESP) 500 MCG TABS tablet Take 1 tablet (500 mcg total) by mouth daily. 30 tablet 11  . Tamsulosin HCl (FLOMAX) 0.4 MG CAPS Take 0.4 mg by mouth Twice daily.      No current facility-administered medications on file prior to visit.   Allergies  Allergen Reactions  . Sulfamethoxazole-Trimethoprim Shortness Of Breath  . Tiotropium Bromide Monohydrate Other (See Comments)    Caused prostate issues, could not void.    Marland Kitchen Antihistamines, Diphenhydramine-Type Other (See Comments)    Keeps patient awake  . Codeine Itching    Nightmares, feels itchy inside-per patient  . Morphine Other (See Comments)    Unknown, possible itchy feelings inside, nightmares  . Other Itching    Surgical Tape  . Azithromycin Rash

## 2014-12-25 NOTE — Telephone Encounter (Signed)
Since [facility] is providing it, he might as well try it. It might cause urinary retention like Spiriva. Since the usual dose for Stiolto is 2 puffs, once daily, Suggest he start by trying just one puff, once daily. If he tolerates that ok, then try 2 puffs once daily.

## 2014-12-25 NOTE — Telephone Encounter (Signed)
Pt states he will think about and if he has any questions then he will call us back. Nothing further needed at this time.

## 2015-01-26 ENCOUNTER — Telehealth: Payer: Self-pay | Admitting: Internal Medicine

## 2015-01-26 DIAGNOSIS — J449 Chronic obstructive pulmonary disease, unspecified: Secondary | ICD-10-CM

## 2015-01-26 MED ORDER — MOMETASONE FURO-FORMOTEROL FUM 100-5 MCG/ACT IN AERO: 2.0000 | INHALATION_SPRAY | Freq: Two times a day (BID) | RESPIRATORY_TRACT | Status: DC

## 2015-01-26 MED ORDER — MOMETASONE FURO-FORMOTEROL FUM 200-5 MCG/ACT IN AERO: 2.0000 | INHALATION_SPRAY | Freq: Two times a day (BID) | RESPIRATORY_TRACT | Status: DC

## 2015-01-26 NOTE — Telephone Encounter (Signed)
Rx for [facility] printed and signed by Dr. Annamaria Boots. Sample of [facility] with signed Rx left up front for patient to pick up. Patient notified, will pick up tomorrow. Nothing further needed.

## 2015-01-26 NOTE — Telephone Encounter (Signed)
Patient called back, he is taking Budesonide through nebulizer.  He wants to know if he should take Symbicort since it has Budesonide with it. Went through medications with patient, he is not taking Daliresp. Started back taking Theophylline '200mg'$  taking 2 tablets per day.  Patient says he has taken Symbicort before and he remembers having trouble with it, but doesn't remember what problem he had.  It is not on his allergy list. Went through allergy list with patient to make sure no new allergies.  Do you want patient to take [facility]?  Patient says he has never tried [place].  If so, what strength would you like to try him on?  Please advise.  Current Outpatient Prescriptions on File Prior to Visit  Medication Sig Dispense Refill  . albuterol (PROAIR HFA) 108 (90 BASE) MCG/ACT inhaler Inhale 2 puffs into the lungs 4 (four) times daily.      Marland Kitchen albuterol (PROVENTIL) (2.5 MG/3ML) 0.083% nebulizer solution USE 1 VIAL IN NEBULIZER 4 TIMES A DAY AS NEEDED FOR WHEEZING 1080 mL 3  . Ascorbic Acid (VITAMIN C) 500 MG tablet Take 500 mg by mouth daily.      . budesonide (PULMICORT) 0.5 MG/2ML nebulizer solution Take 2 mLs (0.5 mg total) by nebulization 2 (two) times daily. 360 mL 3  . formoterol (FORADIL) 12 MCG capsule for inhaler Place 12 mcg into inhaler and inhale 2 (two) times daily.      Marland Kitchen glucosamine-chondroitin 500-400 MG tablet Take 1 tablet by mouth daily.      . hydrocortisone (ANUSOL-HC) 2.5 % rectal cream Place 1 application rectally daily.    Marland Kitchen ibuprofen (ADVIL,MOTRIN) 200 MG tablet Take 600 mg by mouth every 6 (six) hours as needed for pain.    . mometasone (ASMANEX 60 METERED DOSES) 220 MCG/INH inhaler Inhale 1 puff into the lungs 2 (two) times daily.      . Multiple Vitamin (MULTIVITAMIN) tablet Take 1 tablet by mouth daily.    Marland Kitchen omeprazole (PRILOSEC) 20 MG capsule Take 20 mg by mouth Daily.     . predniSONE (DELTASONE) 5 MG tablet Take 1 tablet (5 mg total) by mouth daily with breakfast. 31  tablet 3  . roflumilast (DALIRESP) 500 MCG TABS tablet Take 1 tablet (500 mcg total) by mouth daily. 30 tablet 11  . Tamsulosin HCl (FLOMAX) 0.4 MG CAPS Take 0.4 mg by mouth Twice daily.      No current facility-administered medications on file prior to visit.   Allergies  Allergen Reactions  . Sulfamethoxazole-Trimethoprim Shortness Of Breath  . Tiotropium Bromide Monohydrate Other (See Comments)    Caused prostate issues, could not void.    Marland Kitchen Antihistamines, Diphenhydramine-Type Other (See Comments)    Keeps patient awake  . Codeine Itching    Nightmares, feels itchy inside-per patient  . Morphine Other (See Comments)    Unknown, possible itchy feelings inside, nightmares  . Other Itching    Surgical Tape  . Azithromycin Rash

## 2015-01-26 NOTE — Telephone Encounter (Signed)
Sorry- let's change and try sample Dulera 100 instead of Symbicort. OK to use the nebulizer meds as he has been. 2 puffs, then rinse mouth well, twice daily

## 2015-01-26 NOTE — Telephone Encounter (Signed)
Patient came by the office today to discuss his oxygen and medications. Patient was taking Foradil and they no longer manufacture this drug, so [place] [facility] sent him Stiolto to replace the Foradil.  Patient does not want to take the Stiolto because it has Spiriva in it.  Patient says that the Spiriva caused him a crushed prostate, he was hospitalized 6 times due to this and does not want to go through this again.  He spoke with his Urologist and they told him he could take Finasteride to try to shrink the prostate and he can then take the Cable.  Patient is afraid to try it because of his past history.  Patient wants to know if Dr. Annamaria Boots can prescribe another medication that works the same as Foradil but will not cause problems with his Prostate.  Patient suffers from Enlarged Prostate.  Also, patient wants [place] to order a new Oxygen tank for him that is lighter weight.  Patient needs order to state specifically:  [employer] Home[employer] Patient convenience pack, M2 Cylinder and Bag, Product ID: [id number], HCPCS Code: [id number], HMESA CODE: [id number]; needs to go through [employer] because [place] will not cover this.  Patient brought by printout of machine and wants it to be faxed with order.  (attached notes and information and put on CY's cart)

## 2015-01-26 NOTE — Telephone Encounter (Signed)
Formoterol is available in 2 similar products- Dulera and Symbicort. Not sure which the [place] provides. They both contain an inhaled steroid, so rinse mouth after use to prevent mouth irritation. Suggest sample and print script Symbicort  80  2 puffs then rinse mouth, twice daily ref prn  Ok to write script for the light portable O2 device he has stipulated  For dx COPD/ emphysema, Chronic  respiratory failure with hypoxia

## 2015-01-26 NOTE — Telephone Encounter (Signed)
Patient returned call and may be reached at [phone]

## 2015-01-26 NOTE — Telephone Encounter (Signed)
Order entered for O2 tank with specifications requested by patient.   Copy of paperwork left at [facility] box to be faxed with order.  Attempted to contact patient regarding medications.   Lmtcb. Awaiting call back.

## 2015-02-18 ENCOUNTER — Telehealth: Payer: Self-pay | Admitting: Internal Medicine

## 2015-02-18 NOTE — Telephone Encounter (Signed)
Called pt and made him aware of below. He is wanting [place] to call the dulera to see why they gave him 200 mcg rather than 100 mcg. If [facility] mcg is [facility] the [facility] can [facility], he wants to know if [facility] is okay with him having this [place] phone # [phone]- Dr. Enedina Finner is the doc at [facility]. Called [facility] and they are closed. Will call in AM

## 2015-02-18 NOTE — Telephone Encounter (Signed)
Called spoke with pt again. He reports he forgot to mention he did speak w/ his pharmacists and was told the dulera was same as asmanex. He also presented the striverdi and was told this was better thank the dulera. Please advise thanks

## 2015-02-18 NOTE — Telephone Encounter (Signed)
We only gave Dulera 200 because that was the only sample we had. My intention was for him to use the Dulera 100.  Stiverdi is not "better than Dulera". It is a long acting bronchodilator. Ruthe Mannan has a similar long acting bronchodilator , and also an anti-inflammatory component.

## 2015-02-18 NOTE — Telephone Encounter (Signed)
[phone], pt cb states he forgot to tell mindy something to add to message

## 2015-02-18 NOTE — Telephone Encounter (Signed)
Spoke with pt. He reports he did pick up sample of dulera but it was for 200 mcg and RX sent to dulera was for 100 mcg (as stated okay by Dr. Annamaria Boots on 12/25/14 phone note). Pt was confused bc then the [place] send him dulera for 200 mcg.  During this he went to the [place] to cancel the RX for stiolto but then was told by the [place] they sent him the wrong letter and they will be providing him with striverdi actually. He was then sent striverdi in the mail. Now the [facility] is telling him if he needs to be on striverdi then Dr. Annamaria Boots needs to submit them with documentation stating why he needs to striverdi VS dulera. Pt tried to explain to the [place] that they are the ones who made the decision to send striverdi w/o anyone's approval.  Pt is fine with staying on dulera if Dr. Annamaria Boots thinks this is fine but would just want clarification on which strength he needs to be on. Please advise Dr. Annamaria Boots thanks

## 2015-02-19 NOTE — Telephone Encounter (Signed)
Called the # for [facility] and was placed on hold x 15 min. wcb

## 2015-02-23 NOTE — Telephone Encounter (Signed)
Called the [place], was put through to a voicemail, lmtcb.

## 2015-02-24 ENCOUNTER — Telehealth: Payer: Self-pay | Admitting: Internal Medicine

## 2015-02-24 NOTE — Telephone Encounter (Signed)
Pt was told to increase a med he was taking for bladder spasms to twice a day ( Unsure of name of med).  When pt did this he started with rash and itching all over . Pt stopped this med 1 week ago.   Pt seen primary md and was given pred taper and finishes this today.  Has Benadryl at home but hasnt taken because it advised not to take if pulmonary issues or enlarged prostate  Pt requesting something to help with itching.  Please advise.

## 2015-02-24 NOTE — Telephone Encounter (Signed)
Pt called back and informed that the medicine was Myrbetrig.  Allergies  Allergen Reactions  . Sulfamethoxazole-Trimethoprim Shortness Of Breath  . Tiotropium Bromide Monohydrate Other (See Comments)    Caused prostate issues, could not void.    Marland Kitchen Antihistamines, Diphenhydramine-Type Other (See Comments)    Keeps patient awake  . Codeine Itching    Nightmares, feels itchy inside-per patient  . Morphine Other (See Comments)    Unknown, possible itchy feelings inside, nightmares  . Other Itching    Surgical Tape  . Azithromycin Rash

## 2015-02-24 NOTE — Telephone Encounter (Signed)
Suggest he try Claritin/ loratadine for itching.

## 2015-02-24 NOTE — Telephone Encounter (Signed)
Spoke with pt and advised of Dr Young's recommendations.  Pt verbalized understanding.  Nothing further needed. 

## 2015-02-25 NOTE — Telephone Encounter (Signed)
Dulera 200 is fine He does not need Asmanex if he is taking Dulera.

## 2015-02-25 NOTE — Telephone Encounter (Signed)
Spoke with pt, following up on the below [place] med issues and wants CY to clarify for him: 1. Can pt take Dulera 200 mcg if the [place] cannot supply him with Dulera 176mg? 2. [place] does [place] want him to continue taking the Asmanex?  Pt states he only has 13 days worth of this medication.    Pt wants to be updated with CY's recs above. I advised pt that [place] the [facility] successful contact with anyone.  [place] states [place] [facility] if necessary.    CY please advise on the above.  Thanks!

## 2015-02-25 NOTE — Telephone Encounter (Signed)
Pt calling requesting that nurse call him before she calls the [place] he can be reached @ [phone].Hillery Hunter

## 2015-02-25 NOTE — Telephone Encounter (Signed)
Pt aware of the below recs. States that he is set up for his Dulera 200 already through the [place], will continue taking this.   Nothing further needed at this time.

## 2015-03-15 ENCOUNTER — Encounter: Payer: Self-pay | Admitting: Internal Medicine

## 2015-03-15 ENCOUNTER — Ambulatory Visit (INDEPENDENT_AMBULATORY_CARE_PROVIDER_SITE_OTHER): Payer: Medicare Other | Admitting: Internal Medicine

## 2015-03-15 VITALS — BP 118/70 | HR 83 | Ht 66.0 in | Wt 156.0 lb

## 2015-03-15 DIAGNOSIS — N390 Urinary tract infection, site not specified: Secondary | ICD-10-CM | POA: Diagnosis not present

## 2015-03-15 DIAGNOSIS — J432 Centrilobular emphysema: Secondary | ICD-10-CM | POA: Diagnosis not present

## 2015-03-15 NOTE — Patient Instructions (Signed)
If you are taking a small pill called roflumilast/ Daliresp-- Stop it.  Since you are taking theophylline again, check the strength in milligrams on the bottle. I think you have 100 mg tabs.     Ok to increase to 3 tabs daily = 300 mg. If you have a different pill strength, please let us know.   Ok to try increasing benadryl to 50 mg every 4-6 hours if needed for itching. Watch out for sleepiness.

## 2015-03-15 NOTE — Progress Notes (Signed)
Patient ID: [name], male    DOB: 1941-04-06, 74 y.o.   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 Last here 11/01/10. Note reviewed. He still feels more short of breath than is comfortable- easily fatigued with exertion and doesn't understand why with good oxygen levels.  Went down to [employer] to dance - not any harder than here. He came back with a cough and was given a cough pill. Advair sample seemed better than spiriva, but is not covered by [place] and he has lost job. Feet still swell. Denies chest pain, palpitation.  PFT today- Severe emphysema. FEV1/FVC 0.39, DLCO 0.36, insignif resp to bronchodilator.  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 Still goes dancing regularly. Cardiologist told him heart was okay and that shortness of breath was due to his lungs. He is now using Asmanex 220, Foradil, as provided by the [place]. There has been no change in his ankle edema which is not dependent. He has no history of DVT. Dr Arlyce Dice did a six-month followup chest x-ray, now 8 years postoperatively after his right upper lobectomy for cancer. The patient wants a chest x-ray at least once a year by me for ongoing long-term surveillance. We discussed chest x-ray versus CT scan. Chest x-ray: 04/12/2011-stable right middle lobe nodule and postoperative scarring. Images were reviewed with him.  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 Treated with antibiotics and prednisone tapers. Has 3 more days on current taper. Head chest x-ray February 16 showing chronic right middle lobe scarring, pectus excavatum. His exercise was dancing and he hasn't been  able to dance because of shortness of breath. Blames lack of exercise and repeated prednisone tapers for 12 pound weight gain and admits he is eating more. We discussed steroid side effects. Wakes with malaise in the morning, weak, scant cough, nasal congestion. Denies sore throat, fever, purulent sputum, palpitation, chest pain. Can't afford pulmonary rehabilitation not covered by Medicare. Asks cheap alternative for Pramosone steroid cream for rectal itch.   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 Increased shortness of breath with exertion. Stays congested with cough and wheeze. He thinks weather is the problem. [facility] gave oxygen 2 L per minute. He has only used it for a total of about 10 minutes since he has had it. He has an oximeter. We reviewed indications for oxygen and assured him it would not make him more dependent. He notices leg edema but is not using his Lasix because he considers diuresis "a nuisance". CXR 04/12/11 reviewed with him IMPRESSION:  Stable nodular area consistent with postoperative scarring in the  right middle lobe. COPD.  Original Report Authenticated By: [name], M.D.    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 Cough has been more productive-  he blames "overwork in hot weather". His primary physician saw him July 27 and gave prednisone and antibiotic. Chest x-ray showed no pneumonia. He dislikes Delsym. Tramadol helped cough but made him dizzy. Nebulizer sometimes helps. 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 Still having SOB with exertion-normally this doesnt happen; took breathing tx and breathing pills this morning.  CXR- 03/25/12- at Dr Johny Shears- NAD.  COPD Assessment Test (CAT) score 7/ 40 Home oxygen from a [place] [employer] company at 2 L. He doesn't use it much. He says he sleeps up and down between sofa and bad. The noise of his oxygen machine bothers him so he tries to limit use. Dyspnea on exertion from the car into his office.  12/11/12- 68 yoM former smoker, followed for COPD/ Gold 3 complicated by hx RUL AdenoCa/ lobectomy      Accompanied today by Dr [name]- friend Follows for : pt states having increase sob, wheezing,  with activity . pt states also has a croup sounding cough.,chest congestion. Was treated with Celebrex for prostatitis. Had increased shortness of breath x 3 weeks, so he quit Celebrex 10 days ago. Productive cough with clear mucus x1 week. Sore right chest on waking for the past several mornings but no tussive pain and no adenopathy. Legs have been tender and feeling more swollen. No history of DVT - self or family.  Does better with albuterol by nebulizer than with metered inhalers.  04/02/13- 75 yoM former smoker, followed for COPD/ Gold 3, lung nodules, complicated by hx RUL AdenoCa/ lobectomy    PET scan was in nonmalignant range. He realizes nodules may be very slow-growing adenocarcinoma  and will keep appointment October 13 for followup chest CT    He has been arguing with the [place] about payment of their bill for oximetry. He has wanted to keep home oxygen but did not desaturate enough on a limited walk test at the [place]. He did qualify on repeat and has oxygen at 2 L used as needed. Some productive cough. He feels well and can dance long enough to complete one song but not two. CT chest 12/17/12 IMPRESSION:  1. No evidence for  acute pulmonary embolus.  2. Suspicious, bilateral upper lobe nodules. Cannot rule out  small pulmonary neoplasms. Advise further evaluation with PET CT.  Original Report Authenticated By: Kerby Moors, M.D.  PET 12/30/12 IMPRESSION:  1. Bilateral upper lobe nodular densities appear less conspicuous  on today's study. There is no malignant range FDG uptake  associated these structures which are favored to be the sequela of  inflammation or infection. However, because low grade pulmonary  adenocarcinoma is may exhibit low level FDG uptake I would advise  follow-up imaging to confirm continued resolution/ stability. The  next follow-up examination should be obtained in 3 months. At this  time a diagnostic CT of the chest should be obtained.  Original Report Authenticated By: Kerby Moors, M.D.  07/29/13-  72 yoM former smoker, followed for COPD/ Gold 3, lung nodules, complicated by hx RUL AdenoCa/ lobectomy    ACUTE VISIT: cough x 1 week-productive-no color but thick; Increased SOB; wheezing. Denies any fever or chills. 10 days of cough, productive white, on Z pak from urgent care. Sore throat resolved.. Started lasix- BNP was 214 on 07/26/13.  Was given  pred taper 12/22 which has not helped.  CT 05/12/13 IMPRESSION:  1. Focal masslike opacity seen on the prior study, 1 in the  posterior right upper lobe in the other in the left upper lobe have  mostly resolved. The right upper lobe lesion is now reflected by  minor reticular scarring and anterior tenting of the obliques  fissure. The left upper lesion is reflected by an area of mild  reticular scarring. There is no residual mass or masslike opacity.  No additional followup of these abnormalities is indicated. These  areas were due to infection or inflammation that has subsequently  resolved.  2. 4 mm left lower lobe performed base nodule is stable.  3. Advanced changes of emphysema and other areas of mild reticular  lung scarring are  also stable. No new lung abnormalities.  Electronically Signed  By: Lajean Manes M.D.  On: 05/12/2013 14:05  10/27/13-72 yoM former smoker, followed for COPD/ Gold 3, lung nodules, complicated by hx RUL AdenoCa/ lobectomy    FOLLOWS FOR: Recently in hospital for COPD exacerbation at [facility], pneumonia left lung.. Pt states he contines to have more SOB than usual. Discharged on Levaquin ending March 5. Using oxygen 3 L/[facility] Cough is less productive and sputum is clear/scant. Pleuritic left rib pain is gone. If not portable oxygen too heavy but desaturates with exertion to 88% on room air. We discussed concentrator. CXR 07/29/13 IMPRESSION:  1. Lung hyperexpansion and bronchitic change without acute  cardiopulmonary disease.  2. Stable postsurgical change of the anterior aspect of the right  middle lobe.  Electronically Signed  By: Sandi Mariscal M.D.  On: 07/29/2013 16:01   02/23/14- 70 yoM former smoker, followed for COPD/ Gold 3, lung nodules, complicated by hx RUL AdenoCa/ lobectomy  FOLLOWS FOR:  Reports having some chest congestion.  Denies any cough or wheezing Not using oxygen 3 L/[facility]. Saturations at home running 96-97% on room air To 3 days of increased chest congestion with white phlegm, not an infection Still using foradil and theophylline, nebulizer twice daily Walking 1.25 miles daily and portable oxygen was too heavy. He paces himself. Oxygen did help when he went dancing because he could go to the side and use it between dances.  06/29/14- 9 yoM former smoker, followed for COPD/ Gold 3, lung nodules, complicated by hx RUL AdenoCa/ lobectomy  FOLLOWS FOR: Chest congestion; coughing up clear phelgm for 3 weeks now-has been using Mucinex to help.  10/02/14- 59 yoM former smoker, followed for COPD/ Gold 3, lung nodules, complicated by hx RUL AdenoCa/ lobectomy, GERD FOLLOWS FOR: Pt reports cough with clear mucus and sob with exertion. He is on 02 2 Liters/  [facility] [place]) as needed. He says his oxygen levels keep dropping. Pt had a cxr on 09/28/14. He has not felt well for 3 months, describing repeated episodes of bronchitis treated by his primary physician with antibiotics and prednisone tapers. 5 days ago he had an episode of chest pain for which she took an aspirin and was referred to ER where he was told EKG was "okay". Aware of GERD His primary physician increased his theophylline because repeated low levels around 3.2. He is now taking 300 mg twice daily. We discussed status is a "rapid metabolizer". We considered Daliresp but he did not do well with that in the past. Maintenance Zithromax considered but it caused rash. He does not tolerate narcotics as a cough syrup option. CXR 09/28/14 IMPRESSION: Findings consistent with chronic obstructive pulmonary  disease. No acute cardiopulmonary abnormality seen. Electronically Signed  By: Marijo Conception, M.D.  On: 09/28/2014 09:53  [birth year]-73 yoM former smoker, followed for COPD/ Gold 3, lung nodules, complicated by hx RUL AdenoCa/ lobectomy, GERD Follows For: Had UTI and bladder spasms meds given for bladder spasms was increased and caused hives. SOB on exertion. Taking pulmonary rehab class.  O2 2L/ [facility]- sleep and exertion Doesn't remember using Dow arrest in the past. He resumed theophylline. Continues pulmonary rehabilitation. Complains that he can no longer dance a full dance anymore on Saturday nights.  Review of Systems-see HPI Constitutional:   No-  weight loss, No-night sweats, fevers, chills, fatigue, lassitude. HEENT:   No-  headaches, difficulty swallowing, tooth/dental problems, sore throat,       No-  sneezing, itching, ear ache, nasal congestion, post nasal drip,  CV:  No- chest pain, no-orthopnea, PND, + swelling in lower extremities, no-dizziness, palpitations Resp: + shortness of breath with exertion or at rest.                +  productive cough,  + non-productive  cough,  No- coughing up of blood.              No-   change in color of mucus.  No- wheezing.   Skin: No-   rash or lesions. GI:  No-   heartburn, indigestion, abdominal pain, nausea, vomiting,  GU:  MS:  No-   joint pain or swelling.   Neuro-     nothing unusual Psych:  No- change in mood or affect. No depression or anxiety.  No memory loss.    Objective:   Physical Exam  General- Alert, Oriented, Affect-appropriate, Distress- none acute Skin- rash-none, lesions- none, excoriation- none Lymphadenopathy- none Head- atraumatic            Eyes- Gross vision intact, PERRLA, conjunctivae clear secretions            Ears- Hearing, canals-normal            Nose- Clear, no-Septal dev, mucus, polyps, erosion, perforation             Throat- Mallampati II , mucosa clear , drainage- none, tonsils- atrophic Neck- flexible , trachea midline, no stridor , thyroid nl, carotid no bruit Chest - symmetrical excursion , unlabored           Heart/CV- RRR  , no murmur , no gallop  , no rub, nl s1 s2                           - JVD- none , edema- trace, stasis changes- none, varices- none           Lung-   Cough-none,Distant/clear, wheeze-none, unlabored dullness-none, rub- none. Came on room air sat 88% at rest           Chest wall-  Abd-  Br/ Gen/ Rectal- Not done, not indicated Extrem- cyanosis- none, clubbing, none, atrophy- none, strength- nl, heavy legs,  Neuro- grossly intact to observation

## 2015-03-18 ENCOUNTER — Telehealth: Payer: Self-pay | Admitting: Internal Medicine

## 2015-03-18 NOTE — Telephone Encounter (Signed)
Spoke with pt. States that [name] is going to cost him $252, he will not be able to afford this. This is reason he has not been taking it.  He felt as though CY was upset with him when he was in here on Monday when he told the nurse he was not taking this medication. Theophylline is on back order and there is no hope of getting that any time soon. He would like to know CY's recommendations from here.  CY - please advise. Thanks.

## 2015-03-18 NOTE — Telephone Encounter (Signed)
Pt is aware of CY's recommendation. Nothing further was needed at this time.

## 2015-03-18 NOTE — Telephone Encounter (Signed)
No worries- I wasn't upset. I just didn't want to be double dosing him with a medicines with overlapping side-effects.   Neither Daliresp or theophylline would likely make a lot of difference.     I will keep watching for ideas that might help him.

## 2015-03-19 NOTE — Assessment & Plan Note (Signed)
Gradual progression of his COPD which is primarily centrilobular emphysema. Previously shown to be a theophylline rapid metabolizer. Plan-increase theophylline to 300 mg daily. Stopped Daliresp

## 2015-03-19 NOTE — Assessment & Plan Note (Signed)
Continues long-term management by urology. Urinary retention limits our use of anticholinergic bronchodilators.

## 2015-05-05 ENCOUNTER — Telehealth: Payer: Self-pay | Admitting: Internal Medicine

## 2015-05-05 NOTE — Telephone Encounter (Signed)
Called and spoke with pt Pt states having conversation with Dr Annamaria Boots in past about not being able to carry around 6lb oxygen cylinder. Pt states that right now he has no quality of life due to this Pt states that he was recently declined while applying for new O2 system through [employer]. Pt was wanting 2lb POC.  Was denied due to the [place] not wanting to pay for it Pt states that he has talked to the rep from [facility] and can get system from [employer]. Total cost is $395.25 Pt states that PA will need to be completed with [facility] to cover the cost of system  Dr  Annamaria Boots, are you ok with the prior authorization be initiated? Please advise  [facility] Appeal Dept [phone] Model # [place] for you [phone] Speak to [facility]

## 2015-05-05 NOTE — Telephone Encounter (Signed)
Patient is calling back about this same issue.  Patient is at [phone].

## 2015-05-06 NOTE — Telephone Encounter (Signed)
pcc's please advise on what needs to be done for this PA.  Thanks!

## 2015-05-06 NOTE — Telephone Encounter (Signed)
Pt is aware of the below information. Nothing further was needed. 

## 2015-05-06 NOTE — Telephone Encounter (Signed)
Yes, we can try for a prior auth for his requested light portable oxygen concentrator on the basis of his diagnoses of chronic hypoxic respiratory failure, COPD mixed type, and lung cancer in remission.

## 2015-05-06 NOTE — Telephone Encounter (Signed)
After speaking to both [place] for you there is some confusion here, this pt will have to call [employer] for you to purchase the POC he has to pay up front for this then they will ship it to him he will then have to summit the cost to [facility] to see if he can be reimbursed but there is no PA to get it paid for pt has to do that after he pays for the [employer]

## 2015-06-25 ENCOUNTER — Telehealth: Payer: Self-pay | Admitting: Internal Medicine

## 2015-06-25 NOTE — Telephone Encounter (Signed)
Pt is aware and he will bring form now. Nothing further needed

## 2015-06-25 NOTE — Telephone Encounter (Signed)
Ok to bring form

## 2015-06-25 NOTE — Telephone Encounter (Signed)
Called spoke with pt. He has a handicap placard form and wants to come by this AM to be signed by Dr. Annamaria Boots.  Please advise Dr. Annamaria Boots thanks

## 2015-07-15 ENCOUNTER — Encounter: Payer: Self-pay | Admitting: Internal Medicine

## 2015-07-15 ENCOUNTER — Ambulatory Visit (INDEPENDENT_AMBULATORY_CARE_PROVIDER_SITE_OTHER): Payer: Medicare Other | Admitting: Internal Medicine

## 2015-07-15 VITALS — BP 128/68 | HR 70 | Ht 66.0 in | Wt 156.8 lb

## 2015-07-15 DIAGNOSIS — J449 Chronic obstructive pulmonary disease, unspecified: Secondary | ICD-10-CM | POA: Diagnosis not present

## 2015-07-15 DIAGNOSIS — Z23 Encounter for immunization: Secondary | ICD-10-CM | POA: Diagnosis not present

## 2015-07-15 NOTE — Patient Instructions (Signed)
Prevnar 13 pneumonia vaccine  Please call as needed

## 2015-07-15 NOTE — Progress Notes (Signed)
Patient ID: SEVAN MCBROOM, male    DOB: 1941-04-06, 74 y.o.   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 Last here 11/01/10. Note reviewed. He still feels more short of breath than is comfortable- easily fatigued with exertion and doesn't understand why with good oxygen levels.  Went down to [employer] to dance - not any harder than here. He came back with a cough and was given a cough pill. Advair sample seemed better than spiriva, but is not covered by [place] and he has lost job. Feet still swell. Denies chest pain, palpitation.  PFT today- Severe emphysema. FEV1/FVC 0.39, DLCO 0.36, insignif resp to bronchodilator.  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 Still goes dancing regularly. Cardiologist told him heart was okay and that shortness of breath was due to his lungs. He is now using Asmanex 220, Foradil, as provided by the [place]. There has been no change in his ankle edema which is not dependent. He has no history of DVT. Dr Arlyce Dice did a six-month followup chest x-ray, now 8 years postoperatively after his right upper lobectomy for cancer. The patient wants a chest x-ray at least once a year by me for ongoing long-term surveillance. We discussed chest x-ray versus CT scan. Chest x-ray: 04/12/2011-stable right middle lobe nodule and postoperative scarring. Images were reviewed with him.  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 Treated with antibiotics and prednisone tapers. Has 3 more days on current taper. Head chest x-ray February 16 showing chronic right middle lobe scarring, pectus excavatum. His exercise was dancing and he hasn't been  able to dance because of shortness of breath. Blames lack of exercise and repeated prednisone tapers for 12 pound weight gain and admits he is eating more. We discussed steroid side effects. Wakes with malaise in the morning, weak, scant cough, nasal congestion. Denies sore throat, fever, purulent sputum, palpitation, chest pain. Can't afford pulmonary rehabilitation not covered by Medicare. Asks cheap alternative for Pramosone steroid cream for rectal itch.   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 Increased shortness of breath with exertion. Stays congested with cough and wheeze. He thinks weather is the problem. [facility] gave oxygen 2 L per minute. He has only used it for a total of about 10 minutes since he has had it. He has an oximeter. We reviewed indications for oxygen and assured him it would not make him more dependent. He notices leg edema but is not using his Lasix because he considers diuresis "a nuisance". CXR 04/12/11 reviewed with him IMPRESSION:  Stable nodular area consistent with postoperative scarring in the  right middle lobe. COPD.  Original Report Authenticated By: [name], M.D.    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 Cough has been more productive-  he blames "overwork in hot weather". His primary physician saw him July 27 and gave prednisone and antibiotic. Chest x-ray showed no pneumonia. He dislikes Delsym. Tramadol helped cough but made him dizzy. Nebulizer sometimes helps. 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 Still having SOB with exertion-normally this doesnt happen; took breathing tx and breathing pills this morning.  CXR- 03/25/12- at Dr Johny Shears- NAD.  COPD Assessment Test (CAT) score 7/ 40 Home oxygen from a [place] [employer] company at 2 L. He doesn't use it much. He says he sleeps up and down between sofa and bad. The noise of his oxygen machine bothers him so he tries to limit use. Dyspnea on exertion from the car into his office.  12/11/12- 68 yoM former smoker, followed for COPD/ Gold 3 complicated by hx RUL AdenoCa/ lobectomy      Accompanied today by Dr [name]- friend Follows for : pt states having increase sob, wheezing,  with activity . pt states also has a croup sounding cough.,chest congestion. Was treated with Celebrex for prostatitis. Had increased shortness of breath x 3 weeks, so he quit Celebrex 10 days ago. Productive cough with clear mucus x1 week. Sore right chest on waking for the past several mornings but no tussive pain and no adenopathy. Legs have been tender and feeling more swollen. No history of DVT - self or family.  Does better with albuterol by nebulizer than with metered inhalers.  04/02/13- 75 yoM former smoker, followed for COPD/ Gold 3, lung nodules, complicated by hx RUL AdenoCa/ lobectomy    PET scan was in nonmalignant range. He realizes nodules may be very slow-growing adenocarcinoma  and will keep appointment October 13 for followup chest CT    He has been arguing with the [place] about payment of their bill for oximetry. He has wanted to keep home oxygen but did not desaturate enough on a limited walk test at the [place]. He did qualify on repeat and has oxygen at 2 L used as needed. Some productive cough. He feels well and can dance long enough to complete one song but not two. CT chest 12/17/12 IMPRESSION:  1. No evidence for  acute pulmonary embolus.  2. Suspicious, bilateral upper lobe nodules. Cannot rule out  small pulmonary neoplasms. Advise further evaluation with PET CT.  Original Report Authenticated By: Kerby Moors, M.D.  PET 12/30/12 IMPRESSION:  1. Bilateral upper lobe nodular densities appear less conspicuous  on today's study. There is no malignant range FDG uptake  associated these structures which are favored to be the sequela of  inflammation or infection. However, because low grade pulmonary  adenocarcinoma is may exhibit low level FDG uptake I would advise  follow-up imaging to confirm continued resolution/ stability. The  next follow-up examination should be obtained in 3 months. At this  time a diagnostic CT of the chest should be obtained.  Original Report Authenticated By: Kerby Moors, M.D.  07/29/13-  72 yoM former smoker, followed for COPD/ Gold 3, lung nodules, complicated by hx RUL AdenoCa/ lobectomy    ACUTE VISIT: cough x 1 week-productive-no color but thick; Increased SOB; wheezing. Denies any fever or chills. 10 days of cough, productive white, on Z pak from urgent care. Sore throat resolved.. Started lasix- BNP was 214 on 07/26/13.  Was given  pred taper 12/22 which has not helped.  CT 05/12/13 IMPRESSION:  1. Focal masslike opacity seen on the prior study, 1 in the  posterior right upper lobe in the other in the left upper lobe have  mostly resolved. The right upper lobe lesion is now reflected by  minor reticular scarring and anterior tenting of the obliques  fissure. The left upper lesion is reflected by an area of mild  reticular scarring. There is no residual mass or masslike opacity.  No additional followup of these abnormalities is indicated. These  areas were due to infection or inflammation that has subsequently  resolved.  2. 4 mm left lower lobe performed base nodule is stable.  3. Advanced changes of emphysema and other areas of mild reticular  lung scarring are  also stable. No new lung abnormalities.  Electronically Signed  By: Lajean Manes M.D.  On: 05/12/2013 14:05  10/27/13-72 yoM former smoker, followed for COPD/ Gold 3, lung nodules, complicated by hx RUL AdenoCa/ lobectomy    FOLLOWS FOR: Recently in hospital for COPD exacerbation at [facility], pneumonia left lung.. Pt states he contines to have more SOB than usual. Discharged on Levaquin ending March 5. Using oxygen 3 L/[facility] Cough is less productive and sputum is clear/scant. Pleuritic left rib pain is gone. If not portable oxygen too heavy but desaturates with exertion to 88% on room air. We discussed concentrator. CXR 07/29/13 IMPRESSION:  1. Lung hyperexpansion and bronchitic change without acute  cardiopulmonary disease.  2. Stable postsurgical change of the anterior aspect of the right  middle lobe.  Electronically Signed  By: Sandi Mariscal M.D.  On: 07/29/2013 16:01   02/23/14- 70 yoM former smoker, followed for COPD/ Gold 3, lung nodules, complicated by hx RUL AdenoCa/ lobectomy  FOLLOWS FOR:  Reports having some chest congestion.  Denies any cough or wheezing Not using oxygen 3 L/[facility]. Saturations at home running 96-97% on room air To 3 days of increased chest congestion with white phlegm, not an infection Still using foradil and theophylline, nebulizer twice daily Walking 1.25 miles daily and portable oxygen was too heavy. He paces himself. Oxygen did help when he went dancing because he could go to the side and use it between dances.  06/29/14- 9 yoM former smoker, followed for COPD/ Gold 3, lung nodules, complicated by hx RUL AdenoCa/ lobectomy  FOLLOWS FOR: Chest congestion; coughing up clear phelgm for 3 weeks now-has been using Mucinex to help.  10/02/14- 59 yoM former smoker, followed for COPD/ Gold 3, lung nodules, complicated by hx RUL AdenoCa/ lobectomy, GERD FOLLOWS FOR: Pt reports cough with clear mucus and sob with exertion. He is on 02 2 Liters/  [facility] [place]) as needed. He says his oxygen levels keep dropping. Pt had a cxr on 09/28/14. He has not felt well for 3 months, describing repeated episodes of bronchitis treated by his primary physician with antibiotics and prednisone tapers. 5 days ago he had an episode of chest pain for which she took an aspirin and was referred to ER where he was told EKG was "okay". Aware of GERD His primary physician increased his theophylline because repeated low levels around 3.2. He is now taking 300 mg twice daily. We discussed status is a "rapid metabolizer". We considered Daliresp but he did not do well with that in the past. Maintenance Zithromax considered but it caused rash. He does not tolerate narcotics as a cough syrup option. CXR 09/28/14 IMPRESSION: Findings consistent with chronic obstructive pulmonary  disease. No acute cardiopulmonary abnormality seen. Electronically Signed  By: Marijo Conception, M.D.  On: 09/28/2014 09:53  8/156/16-73 yoM former smoker, followed for COPD/ Gold 3, lung nodules, complicated by hx RUL AdenoCa/ lobectomy, GERD Follows For: Had UTI and bladder spasms meds given for bladder spasms was increased and caused hives. SOB on exertion. Taking pulmonary rehab class.  O2 2L/ [facility]- sleep and exertion Doesn't remember using Dow arrest in the past. He resumed theophylline. Continues pulmonary rehabilitation. Complains that he can no longer dance a full dance anymore on Saturday nights.  07/15/2015-74 year old male former smoker followed for COPD/Gold 3, lung nodules, complicated by history RUL AdenoCa/ lobectomy, GERD O2  1-2L/ [facility] sleep and prn Follows for: COPD. Pt c/o chest congestion, cough with clear mucus and occasional wheeze. Pt does have O2 @ 1L for exertion.  Feels well now dancing now able to do 2 dances in a row which is very good for him. Fewer exacerbations this year. Kristen Loader He now uses a portable liquid system that he can fill himself  Review  of Systems-see HPI Constitutional:   No-  weight loss, No-night sweats, fevers, chills, fatigue, lassitude. HEENT:   No-  headaches, difficulty swallowing, tooth/dental problems, sore throat,       No-  sneezing, itching, ear ache, nasal congestion, post nasal drip,  CV:  No- chest pain, no-orthopnea, PND, + swelling in lower extremities, no-dizziness, palpitations Resp: + shortness of breath with exertion or at rest.                +  productive cough,  + non-productive cough,  No- coughing up of blood.              No-   change in color of mucus.  No- wheezing.   Skin: No-   rash or lesions. GI:  No-   heartburn, indigestion, abdominal pain, nausea, vomiting,  GU:  MS:  No-   joint pain or swelling.   Neuro-     nothing unusual Psych:  No- change in mood or affect. No depression or anxiety.  No memory loss.    Objective:   Physical Exam  General- Alert, Oriented, Affect-appropriate, Distress- none acute Skin- rash-none, lesions- none, excoriation- none Lymphadenopathy- none Head- atraumatic            Eyes- Gross vision intact, PERRLA, conjunctivae clear secretions            Ears- Hearing, canals-normal            Nose- Clear, no-Septal dev, mucus, polyps, erosion, perforation             Throat- Mallampati II , mucosa clear , drainage- none, tonsils- atrophic Neck- flexible , trachea midline, no stridor , thyroid nl, carotid no bruit Chest - symmetrical excursion , unlabored           Heart/CV- RRR  , no murmur , no gallop  , no rub, nl s1 s2                           - JVD- none , edema- trace, stasis changes- none, varices- none           Lung-   Cough-none,Distant/clear, wheeze-none, unlabored dullness-none, rub- none.            Chest wall-  Abd-  Br/ Gen/ Rectal- Not done, not indicated Extrem- cyanosis- none, clubbing, none, atrophy- none, strength- nl, heavy legs,  Neuro- grossly intact to observation

## 2015-07-17 NOTE — Assessment & Plan Note (Signed)
Oxygen dependent. We discussed use of prednisone 5 mg every other day if needed to control mild flares. Prevnar pneumonia vaccine

## 2015-11-23 DIAGNOSIS — H25812 Combined forms of age-related cataract, left eye: Secondary | ICD-10-CM | POA: Insufficient documentation

## 2016-01-13 ENCOUNTER — Ambulatory Visit: Payer: Medicare Other | Admitting: Internal Medicine

## 2016-02-14 ENCOUNTER — Other Ambulatory Visit: Payer: Self-pay | Admitting: Internal Medicine

## 2016-05-23 DIAGNOSIS — K439 Ventral hernia without obstruction or gangrene: Secondary | ICD-10-CM | POA: Insufficient documentation

## 2016-05-25 ENCOUNTER — Telehealth: Payer: Self-pay | Admitting: Internal Medicine

## 2016-05-25 NOTE — Telephone Encounter (Signed)
[name], please advise when you want to have this pt worked in, thanks

## 2016-05-25 NOTE — Telephone Encounter (Signed)
Patient is needing surgical clearance for hernia repair- pt has not been seen in almost 1 year.   CY please advise if pt can be worked in, or if he needs to see another provider for this.  Thanks!

## 2016-05-25 NOTE — Telephone Encounter (Signed)
lmtcb X1 on named voicemail for Dr. Arvin Collard' nurse re: sx clearance

## 2016-05-25 NOTE — Telephone Encounter (Signed)
Katie- discussed. Work in when able next 1-2 weeks

## 2016-05-25 NOTE — Telephone Encounter (Signed)
[name] with Dr. Arvin Collard' office returned call. Surgery is for epigastric hernia repair.  CB is [phone].

## 2016-05-25 NOTE — Telephone Encounter (Signed)
Pt is aware that I have worked him in to see CY on 06-01-16 at 10:30am. Nothing more needed at this time.

## 2016-06-01 ENCOUNTER — Ambulatory Visit (INDEPENDENT_AMBULATORY_CARE_PROVIDER_SITE_OTHER): Payer: Medicare Other | Admitting: Internal Medicine

## 2016-06-01 ENCOUNTER — Encounter: Payer: Self-pay | Admitting: Internal Medicine

## 2016-06-01 ENCOUNTER — Telehealth: Payer: Self-pay | Admitting: Internal Medicine

## 2016-06-01 DIAGNOSIS — J449 Chronic obstructive pulmonary disease, unspecified: Secondary | ICD-10-CM | POA: Diagnosis not present

## 2016-06-01 MED ORDER — AZITHROMYCIN 250 MG PO TABS: ORAL_TABLET | ORAL | 0 refills | Status: DC

## 2016-06-01 MED ORDER — PREDNISONE 10 MG PO TABS: ORAL_TABLET | ORAL | 0 refills | Status: DC

## 2016-06-01 NOTE — Assessment & Plan Note (Signed)
He reports peripheral edema substantially improved when he stopped eating ham. He had found ham easy to eat with his poor dentition.

## 2016-06-01 NOTE — Telephone Encounter (Signed)
Will route message to Joellen Jersey so that once OV note is done it can be faxed.

## 2016-06-01 NOTE — Patient Instructions (Addendum)
Scripts sent for prednisone taper and Z pak  I hope you can get the surgery taken care of quickly  Please call if I can help

## 2016-06-01 NOTE — Assessment & Plan Note (Signed)
Recent acute bronchitis now largely resolved. He wants some help fully resolving this in advance of his surgery. We discussed options. I think he is about as stable as he is able to get and should tolerate necessary surgery and anesthesia with appropriate attention to his pulmonary status through the surgical stay. Plan-Z-Pak, prednisone taper. Okay to go forward with planned surgery from pulmonary standpoint.

## 2016-06-01 NOTE — Progress Notes (Signed)
Patient ID: SHAUNAK KREIS, male    DOB: 17-Oct-1940, 75 y.o.   MRN: [id number]  HPI M former smoker, followed for COPD complicated by hx RUL AdenoCa/ lobectomy 2005.   8/156/16-73 yoM former smoker, followed for COPD/ Gold 3, lung nodules, complicated by hx RUL AdenoCa/ lobectomy, GERD Follows For: Had UTI and bladder spasms meds given for bladder spasms was increased and caused hives. SOB on exertion. Taking pulmonary rehab class.  O2 2L/ [facility]- sleep and exertion Doesn't remember using Dow arrest in the past. He resumed theophylline. Continues pulmonary rehabilitation. Complains that he can no longer dance a full dance anymore on Saturday nights.  07/15/2015-75 year old male former smoker followed for COPD/Gold 3, lung nodules, complicated by history RUL AdenoCa/ lobectomy, GERD O2  1-2L/ [facility] sleep and prn Follows for: COPD. Pt c/o chest congestion, cough with clear mucus and occasional wheeze. Pt does have O2 @ 1L for exertion.  Feels well now dancing now able to do 2 dances in a row which is very good for him. Fewer exacerbations this year. 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 Pt states he has congestion, no tightness, no SOB. Pt states he hasnt been breathing good in a couple of weeks. Cough w/ clear mucus.   He describes some variable chest congestion and rattle. Cough is increased some with recent cool weather changes. PCP gave 10 days of doxycycline. Sputum is now quite. Chest x-ray was reported stable COPD. Has had flu vaccine. Significant BPH despite TURP, still limits him from using LAMA drugs. He is scheduled for ventral hernia repair and asks a clearance letter. He is not describing chest pain, palpitations, acute infection  or other recent instability pertinent to proposed surgery. CXR 09/28/2014 IMPRESSION: Findings consistent with chronic obstructive pulmonary disease. No acute cardiopulmonary abnormality seen.  Review of Systems-see HPI Constitutional:   No-  weight loss, No-night sweats, fevers, chills, fatigue, lassitude. HEENT:   No-  headaches, difficulty swallowing, tooth/dental problems, sore throat,       No-  sneezing, itching, ear ache, nasal congestion, post nasal drip,  CV:  No- chest pain, no-orthopnea, PND, + swelling in lower extremities, no-dizziness, palpitations Resp: + shortness of breath with exertion or at rest.                +  productive cough,  + non-productive cough,  No- coughing up of blood.              No-   change in color of mucus.  No- wheezing.   Skin: No-   rash or lesions. GI:  No-   heartburn, indigestion, abdominal pain, nausea, vomiting,  GU:  MS:  No-   joint pain or swelling.   Neuro-     nothing unusual Psych:  No- change in mood or affect. No depression or anxiety.  No memory loss.    Objective:   Physical Exam  General- Alert, Oriented, Affect-appropriate, Distress- none acute Skin- rash-none, lesions- none, excoriation- none Lymphadenopathy- none Head- atraumatic            Eyes- Gross vision intact, PERRLA, conjunctivae clear secretions            Ears- Hearing, canals-normal  Nose- Clear, no-Septal dev, mucus, polyps, erosion, perforation             Throat- Mallampati II , mucosa clear , drainage- none, tonsils- atrophic, + partially edentulous Neck- flexible , trachea midline, no stridor , thyroid nl, carotid no bruit Chest - symmetrical excursion , unlabored           Heart/CV- RRR  , no murmur , no gallop  , no rub, nl s1 s2                           - JVD- none , edema- + trace, stasis changes- none, varices- none           Lung-   distant, cough + rattling upper airway,Distant/clear, wheeze-none, unlabored dullness-none, rub- none.             Chest wall-  Abd-  Br/ Gen/ Rectal- Not done, not indicated Extrem- cyanosis- none, clubbing, none, atrophy- none, strength- nl, heavy legs,  Neuro- grossly intact to observation

## 2016-06-02 NOTE — Telephone Encounter (Signed)
Office note is completed.. Note faxed to Dr. Arvin Collard' office.   Nothing further needed.

## 2016-08-08 ENCOUNTER — Telehealth: Payer: Self-pay | Admitting: Internal Medicine

## 2016-08-08 MED ORDER — AZITHROMYCIN 250 MG PO TABS: 250.0000 mg | ORAL_TABLET | ORAL | 0 refills | Status: DC

## 2016-08-08 MED ORDER — PREDNISONE 10 MG PO TABS: ORAL_TABLET | ORAL | 0 refills | Status: DC

## 2016-08-08 NOTE — Telephone Encounter (Signed)
Spoke with pt, who c/o chest congestion, prod cough with clear mucus & occ wheezing X2w  Pt denies any fever, chills or sweats. Pt is requesting abx and prednisone. Pt is not currently taking any OTC medications.  CY please advise. Thanks.   Current Outpatient Prescriptions on File Prior to Visit  Medication Sig Dispense Refill  . albuterol (PROAIR HFA) 108 (90 BASE) MCG/ACT inhaler Inhale 2 puffs into the lungs 4 (four) times daily.      Marland Kitchen albuterol (PROVENTIL) (2.5 MG/3ML) 0.083% nebulizer solution USE 1 VIAL IN NEBULIZER 4 TIMES A DAY AS NEEDED FOR WHEEZING 1080 mL 0  . Ascorbic Acid (VITAMIN C) 500 MG tablet Take 500 mg by mouth daily.      Marland Kitchen azithromycin (ZITHROMAX) 250 MG tablet 2 today then one daily 6 each 0  . budesonide (PULMICORT) 0.5 MG/2ML nebulizer solution Take 2 mLs (0.5 mg total) by nebulization 2 (two) times daily. 360 mL 3  . glucosamine-chondroitin 500-400 MG tablet Take 1 tablet by mouth daily.      . hydrocortisone (ANUSOL-HC) 2.5 % rectal cream Place 1 application rectally daily.    Marland Kitchen ibuprofen (ADVIL,MOTRIN) 200 MG tablet Take 600 mg by mouth every 6 (six) hours as needed for pain.    . mometasone-formoterol (DULERA) 200-5 MCG/ACT AERO Inhale 2 puffs into the lungs 2 (two) times daily. 1 Inhaler 0  . omeprazole (PRILOSEC) 20 MG capsule Take 20 mg by mouth Daily.     . predniSONE (DELTASONE) 10 MG tablet 4 X 2 DAYS, 3 X 2 DAYS, 2 X 2 DAYS, 1 X 2 DAYS 20 tablet 0  . Tamsulosin HCl (FLOMAX) 0.4 MG CAPS Take 0.4 mg by mouth Twice daily.      No current facility-administered medications on file prior to visit.     Allergies  Allergen Reactions  . Sulfamethoxazole-Trimethoprim Shortness Of Breath  . Tiotropium Bromide Monohydrate Other (See Comments)    Caused prostate issues, could not void.    Marland Kitchen Antihistamines, Diphenhydramine-Type Other (See Comments)    Keeps patient awake  . Codeine Itching    Nightmares, feels itchy inside-per patient  . Morphine Other (See  Comments)    Unknown, possible itchy feelings inside, nightmares  . Other Itching    Surgical Tape  . Azithromycin Rash

## 2016-08-08 NOTE — Telephone Encounter (Signed)
Offer Zpak   # 6,  2 today then one daily           Prednisone 10 mg, # 20, 4 X 2 DAYS, 3 X 2 DAYS, 2 X 2 DAYS, 1 X 2 DAYS

## 2016-08-08 NOTE — Telephone Encounter (Signed)
Spoke with the pt and notified of recs per CDY  He verbalized understanding  He wants the azithromycin removed from allergy list  He has taken this multiple times with no problem  Rxs sent to pharm and allergy list updated

## 2016-09-25 DIAGNOSIS — I482 Chronic atrial fibrillation, unspecified: Secondary | ICD-10-CM | POA: Insufficient documentation

## 2017-05-02 ENCOUNTER — Telehealth: Payer: Self-pay | Admitting: Internal Medicine

## 2017-05-02 NOTE — Telephone Encounter (Signed)
Pt is requesting that we prescribe a humidifier machine (not for his O2 but for a stand-alone humidifier machine) to [facility] on [address].  Pt states that if we send a rx for this he can get the majority of it covered by his insurance.   CY please advise if ok to prescribe.  Thanks.

## 2017-05-03 MED ORDER — COOL MIST HUMIDIFIER MISC: 0 refills | Status: DC

## 2017-05-03 NOTE — Telephone Encounter (Signed)
Pt called back: [facility] Ultr[employer]ic Humidifier soothing, cool mist at $49.99. Pt request a callback when the humidifier is called in for him, pt contact # [phone]

## 2017-05-03 NOTE — Telephone Encounter (Signed)
Ok to write script. Doubt Epic has line item for it, but maybe.  Room humidifier for dx COPD mixed type.

## 2017-05-03 NOTE — Telephone Encounter (Signed)
Spoke with pt and advised that rx for cool mist humidifier was sent to pharmacy.  PT verbalized understanding.  Nothing further needed.

## 2017-05-03 NOTE — Telephone Encounter (Signed)
Spoke with pt and advised that we can send in rx for humidifier.  Pt would like to go to pharmacy and get info on what brand he would like so we can call in the specific one.  PT will call back with this info.

## 2017-07-17 DIAGNOSIS — B338 Other specified viral diseases: Secondary | ICD-10-CM | POA: Insufficient documentation

## 2017-11-08 DIAGNOSIS — K4090 Unilateral inguinal hernia, without obstruction or gangrene, not specified as recurrent: Secondary | ICD-10-CM | POA: Insufficient documentation

## 2017-12-04 DIAGNOSIS — R339 Retention of urine, unspecified: Secondary | ICD-10-CM | POA: Insufficient documentation

## 2019-07-31 DIAGNOSIS — D649 Anemia, unspecified: Secondary | ICD-10-CM | POA: Insufficient documentation

## 2021-07-11 DIAGNOSIS — R609 Edema, unspecified: Secondary | ICD-10-CM | POA: Insufficient documentation

## 2021-07-14 DIAGNOSIS — I5033 Acute on chronic diastolic (congestive) heart failure: Secondary | ICD-10-CM | POA: Insufficient documentation

## 2021-09-28 ENCOUNTER — Encounter: Payer: Self-pay | Admitting: Family Medicine

## 2021-09-28 ENCOUNTER — Ambulatory Visit (INDEPENDENT_AMBULATORY_CARE_PROVIDER_SITE_OTHER): Payer: Medicare Other | Admitting: Family Medicine

## 2021-09-28 ENCOUNTER — Other Ambulatory Visit: Payer: Self-pay

## 2021-09-28 DIAGNOSIS — C3491 Malignant neoplasm of unspecified part of right bronchus or lung: Secondary | ICD-10-CM | POA: Diagnosis not present

## 2021-09-28 DIAGNOSIS — R002 Palpitations: Secondary | ICD-10-CM | POA: Insufficient documentation

## 2021-09-28 DIAGNOSIS — I482 Chronic atrial fibrillation, unspecified: Secondary | ICD-10-CM

## 2021-09-28 DIAGNOSIS — M79641 Pain in right hand: Secondary | ICD-10-CM | POA: Insufficient documentation

## 2021-09-28 DIAGNOSIS — D483 Neoplasm of uncertain behavior of retroperitoneum: Secondary | ICD-10-CM | POA: Insufficient documentation

## 2021-09-28 DIAGNOSIS — J441 Chronic obstructive pulmonary disease with (acute) exacerbation: Secondary | ICD-10-CM

## 2021-09-28 DIAGNOSIS — M1990 Unspecified osteoarthritis, unspecified site: Secondary | ICD-10-CM | POA: Insufficient documentation

## 2021-09-28 DIAGNOSIS — H04123 Dry eye syndrome of bilateral lacrimal glands: Secondary | ICD-10-CM | POA: Insufficient documentation

## 2021-09-28 DIAGNOSIS — R1900 Intra-abdominal and pelvic swelling, mass and lump, unspecified site: Secondary | ICD-10-CM | POA: Insufficient documentation

## 2021-09-28 DIAGNOSIS — C494 Malignant neoplasm of connective and soft tissue of abdomen: Secondary | ICD-10-CM | POA: Insufficient documentation

## 2021-09-28 DIAGNOSIS — L570 Actinic keratosis: Secondary | ICD-10-CM | POA: Insufficient documentation

## 2021-09-28 MED ORDER — METHYLPREDNISOLONE 4 MG PO TBPK: ORAL_TABLET | ORAL | 0 refills | Status: DC

## 2021-09-28 NOTE — Progress Notes (Signed)
?[name] - 81 y.o. male MRN [id number]  Date of birth: July 30, 1941 ? ?Subjective ?Chief Complaint  ?Patient presents with  ? New Patient (Initial Visit)  ? ? ?HPI ?[name] is a 81 year old male here today for initial visit to establish care.  He has a history of severe COPD, atrial fibrillation, lung cancer status post resection and BPH. ? ?He is seeing pulmonology for management of his COPD.  He is oxygen dependent.  He has had a little more dyspnea recently with some increased wheezing.  He is using his inhalers as directed. ? ?He has seen cardiology for management of atrial fibrillation.  Currently treated with flecainide.  He is anticoagulated with Eliquis.  He is tolerating these well at this time. ? ?He is seeing urology for BPH.  This is managed with finasteride and tamsulosin. ? ?ROS:  A comprehensive ROS was completed and negative except as noted per HPI ? ? ?Allergies  ?Allergen Reactions  ? Celecoxib Shortness Of Breath  ? Prednisone Shortness Of Breath  ? Sulfamethoxazole-Trimethoprim Shortness Of Breath  ? Tiotropium Bromide Monohydrate Other (See Comments)  ?  Caused prostate issues, could not void.    ? Antihistamines, Chlorpheniramine-Type Other (See Comments)  ?  Caused prostate issues, could not void.   ?All antihistamines - "crawling inside"  ? Antihistamines, Diphenhydramine-Type Other (See Comments)  ?  Keeps patient awake  ? Codeine Itching  ?  Nightmares, feels itchy inside-per patient  ? Morphine Other (See Comments)  ?  Unknown, possible itchy feelings inside, nightmares  ? Other Itching  ?  Surgical Tape  ? Tiotropium Other (See Comments)  ? Tape Rash  ? ? ?Past Medical History:  ?Diagnosis Date  ? Adenocarcinoma ([place])   ? RUL  ? Cancer [facility])   ? lung ca 2005  ? COPD (chronic obstructive pulmonary disease) ([place])   ? PNA (pneumonia)   ? as a child and 2004  ? Prostate enlargement   ? ? ?Past Surgical History:  ?Procedure Laterality Date  ? APPENDECTOMY    ? cyst on wrist    ? LAPAROSCOPIC  CHOLECYSTECTOMY    ? Left inguinal herniorrhaphy.    ? LOBECTOMY    ? RUL 2005 no adjuvant  ? TONSILLECTOMY    ? Transurethral resection of prostate (Gyrus vaporization)    ? ? ?Social History  ? ?Socioeconomic History  ? Marital status: Divorced  ?  Spouse name: Not on file  ? Number of children: Not on file  ? Years of education: Not on file  ? Highest education level: Not on file  ?Occupational History  ? 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  Employer: RETIRED  ? Occupation: Environmental consultant in [employer]  ?Tobacco Use  ? Smoking status: Former  ?  Packs/day: 2.50  ?  Years: 45.00  ?  Pack years: 112.50  ?  Types: Cigarettes  ?  Quit date: 09/09/2002  ?  Years since quitting: 19.0  ? Smokeless tobacco: Never  ?Substance and Sexual Activity  ? Alcohol use: Yes  ?  Alcohol/week: 1.0 standard drink  ?  Types: 1 Cans of beer per week  ? Drug use: No  ? Sexual activity: Not Currently  ?Other Topics Concern  ? Not on file  ?Social History Narrative  ? Not on file  ? ?Social Determinants of Health  ? ?Financial Resource Strain: Not on file  ?Food Insecurity: Not on file  ?Transportation Needs: Not on file  ?Physical Activity: Not  on file  ?Stress: Not on file  ?Social Connections: Not on file  ? ? ?Family History  ?Problem Relation Age of Onset  ? Asthma Son   ? ? ?Health Maintenance  ?Topic Date Due  ? Hepatitis C Screening  Never done  ? Zoster Vaccines- Shingrix (1 of 2) Never done  ? COVID-19 Vaccine (3 - Pfizer risk series) 10/21/2019  ? INFLUENZA VACCINE  02/28/2021  ? TETANUS/TDAP  09/09/2021  ? Pneumonia Vaccine 58+ Years old  Completed  ? HPV VACCINES  Aged Out  ? ? ? ?----------------------------------------------------------------------------------------------------------------------------------------------------------------------------------------------------------------- ?Physical Exam ?BP 135/64   Pulse 82   Temp 97.7 ?F (36.5 ?C)   Ht 5\' 6"  (1.676 m)   Wt 117 lb (53.1 kg)   SpO2 95%   BMI 18.88 kg/m?   ? ?Physical Exam ?Constitutional:   ?   Appearance: Normal appearance.  ?Eyes:  ?   General: No scleral icterus. ?Cardiovascular:  ?   Rate and Rhythm: Normal rate and regular rhythm.  ?Pulmonary:  ?   Effort: Pulmonary effort is normal.  ?   Breath sounds: Normal breath sounds.  ?Musculoskeletal:  ?   Cervical back: Neck supple.  ?Neurological:  ?   General: No focal deficit present.  ?   Mental Status: He is alert.  ?Psychiatric:     ?   Mood and Affect: Mood normal.     ?   Behavior: Behavior normal.  ? ? ?------------------------------------------------------------------------------------------------------------------------------------------------------------------------------------------------------------------- ?Assessment and Plan ? ?Chronic atrial fibrillation (HCC) ?Management per cardiology.  Stable with flecainide and Eliquis. ? ?Adenocarcinoma of lung in remission ?Status post wedge resection of right upper lobe and right middle lobe lesions.  He has not had evidence of recurrence since that time. ? ?Chronic obstructive pulmonary disease with (acute) exacerbation ([place]) ?Increased dyspnea and wheezing.  Adding Medrol Dosepak.  Encouraged to follow-up with pulmonology. ? ?Hypertrophy of prostate without urinary obstruction and other lower urinary tract symptoms (LUTS) ?Management per urology.  Symptoms are well managed with finasteride and tamsulosin. ? ? ?Meds ordered this encounter  ?Medications  ? methylPREDNISolone (MEDROL DOSEPAK) 4 MG TBPK tablet  ?  Sig: Taper as directed on packaging.  ?  Dispense:  21 tablet  ?  Refill:  0  ? ? ?Return in about 3 months (around 12/29/2021) for annual with fasting labs. ? ? ? ?This visit occurred during the SARS-CoV-2 public health emergency.  Safety protocols were in place, including screening questions prior to the visit, additional usage of staff PPE, and extensive cleaning of exam room while observing appropriate contact time as indicated for disinfecting  solutions.  ? ?

## 2021-09-28 NOTE — Assessment & Plan Note (Signed)
Management per urology.  Symptoms are well managed with finasteride and tamsulosin. ?

## 2021-09-28 NOTE — Assessment & Plan Note (Signed)
Management per cardiology.  Stable with flecainide and Eliquis. ?

## 2021-09-28 NOTE — Assessment & Plan Note (Signed)
Status post wedge resection of right upper lobe and right middle lobe lesions.  He has not had evidence of recurrence since that time. ?

## 2021-09-28 NOTE — Assessment & Plan Note (Signed)
Increased dyspnea and wheezing.  Adding Medrol Dosepak.  Encouraged to follow-up with pulmonology. ?

## 2021-09-28 NOTE — Patient Instructions (Signed)
Very nice to meet you! ?Start methylprednisolone taper.  ?Keep appt with pulmonology.   ?

## 2021-12-29 ENCOUNTER — Ambulatory Visit: Payer: Medicare Other | Admitting: Family Medicine

## 2022-02-14 ENCOUNTER — Encounter: Payer: Self-pay | Admitting: Family Medicine

## 2022-02-14 ENCOUNTER — Ambulatory Visit (INDEPENDENT_AMBULATORY_CARE_PROVIDER_SITE_OTHER): Payer: Medicare Other | Admitting: Family Medicine

## 2022-02-14 DIAGNOSIS — J3489 Other specified disorders of nose and nasal sinuses: Secondary | ICD-10-CM | POA: Diagnosis not present

## 2022-02-14 DIAGNOSIS — J441 Chronic obstructive pulmonary disease with (acute) exacerbation: Secondary | ICD-10-CM

## 2022-02-14 DIAGNOSIS — C494 Malignant neoplasm of connective and soft tissue of abdomen: Secondary | ICD-10-CM

## 2022-02-14 DIAGNOSIS — I482 Chronic atrial fibrillation, unspecified: Secondary | ICD-10-CM | POA: Diagnosis not present

## 2022-02-14 DIAGNOSIS — L853 Xerosis cutis: Secondary | ICD-10-CM | POA: Diagnosis not present

## 2022-02-14 MED ORDER — MIRTAZAPINE 7.5 MG PO TABS: 7.5000 mg | ORAL_TABLET | Freq: Every day | ORAL | 3 refills | Status: DC

## 2022-02-14 MED ORDER — METHYLPREDNISOLONE 4 MG PO TBPK: ORAL_TABLET | ORAL | 0 refills | Status: DC

## 2022-02-14 MED ORDER — IPRATROPIUM BROMIDE 0.06 % NA SOLN
2.0000 | Freq: Four times a day (QID) | NASAL | 12 refills | Status: DC
Start: 2022-02-14 — End: 2024-05-22

## 2022-02-14 MED ORDER — AMMONIUM LACTATE 12 % EX LOTN: 1.0000 | TOPICAL_LOTION | CUTANEOUS | 0 refills | Status: DC | PRN

## 2022-02-14 NOTE — Patient Instructions (Addendum)
Try ammonium lactate lotion on the arms and back.   Try mirtazapine for appetite and sleep.   Try ipratropium nasal spray for nasal drainge.  I would also recommend adding claritin daily.   Use methylprednisolone for breathing.

## 2022-02-14 NOTE — Assessment & Plan Note (Signed)
Chronic management by pulmonology.  Continues on 3 L O2 chronically.  Having some increased dyspnea and wheezing.  Adding course of methylprednisolone.  He will continue current inhalers.

## 2022-02-14 NOTE — Assessment & Plan Note (Signed)
Follow-up oncology.  He has had decreased appetite and weight loss.  Adding mirtazapine to see if this is helpful for his appetite.

## 2022-02-14 NOTE — Progress Notes (Signed)
[name] - 81 y.o. male MRN [id number]  Date of birth: 08-21-1940  Subjective No chief complaint on file.   HPI [name] is a 81 year old male here today for follow-up.  He has several concerns today.  He has prior history of lung cancer and COPD with chronic respiratory failure.  He is O2 dependent.  He does continue to see pulmonology regularly.  He feels like his breathing has been a little worse here recently.  He is a little more short of breath with some increased wheezing.  He is not using his budesonide regularly.  He is using Dulera daily with albuterol as needed.  Continues to see cardiology for management of chronic A-fib.  Remains on flecainide and Eliquis.  He has had problems with chronic rhinitis and postnasal drainage.  He has tried saline rinses without much improvement.  He has had some itchy watery eyes as well.  He has not tried any allergy medications.  He does tell me that he has a "tumor in his back".  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  Due to his other comorbidities he has not been able to have biopsy completed.  He has had decreased appetite and weight loss.  Currently following with serial CT scans.    He is having some white spots on his skin that he is concerned about.  ROS:  A comprehensive ROS was completed and negative except as noted per HPI   Allergies  Allergen Reactions   Celecoxib Shortness Of Breath   Prednisone Shortness Of Breath   Sulfamethoxazole-Trimethoprim Shortness Of Breath   Tiotropium Bromide Monohydrate Other (See Comments)    Caused prostate issues, could not void.     Antihistamines, Chlorpheniramine-Type Other (See Comments)    Caused prostate issues, could not void.   All antihistamines - "crawling inside"   Antihistamines, Diphenhydramine-Type Other (See Comments)    Keeps patient awake   Codeine Itching    Nightmares, feels itchy inside-per patient    Morphine Other (See Comments)    Unknown, possible itchy feelings inside, nightmares   Other Itching    Surgical Tape   Tiotropium Other (See Comments)   Tape Rash    Past Medical History:  Diagnosis Date   Adenocarcinoma ([place])    RUL   Cancer ([place])    lung ca 2005   COPD (chronic obstructive pulmonary disease) (HCC)    PNA (pneumonia)    as a child and 2004   Prostate enlargement     Past Surgical History:  Procedure Laterality Date   APPENDECTOMY     cyst on wrist     LAPAROSCOPIC CHOLECYSTECTOMY     Left inguinal herniorrhaphy.     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   Occupation: Environmental consultant in [employer]  Tobacco Use   Smoking status: Former    Packs/day: 2.50    Years: 45.00    Total pack years: 112.50    Types: Cigarettes  Quit date: 09/09/2002    Years since quitting: 19.4   Smokeless tobacco: Never  Substance and Sexual Activity   Alcohol use: Yes    Alcohol/week: 1.0 standard drink of alcohol    Types: 1 Cans of beer per week   Drug use: No   Sexual activity: Not Currently  Other Topics Concern   Not on file  Social History Narrative   Not on file   Social Determinants of Health   Financial Resource Strain: Not on file  Food Insecurity: Not on file  Transportation Needs: Not on file  Physical Activity: Not on file  Stress: Not on file  Social Connections: Not on file    Family History  Problem Relation Age of Onset   Asthma Son     Health Maintenance  Topic Date Due   COVID-19 Vaccine (3 - Pfizer risk series) 05/17/2022 (Originally 10/21/2019)   Zoster Vaccines-  Shingrix (1 of 2) 05/17/2022 (Originally 04/24/1961)   TETANUS/TDAP  02/15/2023 (Originally 09/09/2021)   Hepatitis C Screening  02/15/2023 (Originally 04/24/1960)   INFLUENZA VACCINE  02/28/2022   Pneumonia Vaccine 43+ Years old  Completed   HPV VACCINES  Aged Out     ----------------------------------------------------------------------------------------------------------------------------------------------------------------------------------------------------------------- Physical Exam BP 138/67 (BP Location: Left Arm, Patient Position: Sitting, Cuff Size: Small)   Pulse 79   Ht 5\' 6"  (1.676 m)   Wt 111 lb (50.3 kg)   SpO2 97%   BMI 17.92 kg/m   Physical Exam Constitutional:      Appearance: Normal appearance.  Eyes:     General: No scleral icterus. Cardiovascular:     Rate and Rhythm: Normal rate.  Pulmonary:     Effort: Pulmonary effort is normal.     Breath sounds: Wheezing present.  Musculoskeletal:     Cervical back: Neck supple.  Skin:    Comments: Skin is dry and thin.  There are few scattered actinic keratoses  Neurological:     Mental Status: He is alert.  Psychiatric:        Mood and Affect: Mood normal.        Behavior: Behavior normal.     ------------------------------------------------------------------------------------------------------------------------------------------------------------------------------------------------------------------- Assessment and Plan  Chronic atrial fibrillation ([place]) Management per cardiology.  Continues on flecainide with Eliquis for anticoagulation.  Chronic obstructive pulmonary disease with (acute) exacerbation (HCC) Chronic management by pulmonology.  Continues on 3 L O2 chronically.  Having some increased dyspnea and wheezing.  Adding course of methylprednisolone.  He will continue current inhalers.  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  He has had decreased  appetite and weight loss.  Adding mirtazapine to see if this is helpful for his appetite.  Rhinorrhea Likely related to chronic use.  May continue saline rinses.  We will add trial of Atrovent nasal spray as well.  Xerosis of skin Ammonium lactate sent in.   Meds ordered this encounter  Medications   ammonium lactate (AMLACTIN) 12 % lotion    Sig: Apply 1 Application topically as needed for dry skin.    Dispense:  400 g    Refill:  0   ipratropium (ATROVENT) 0.06 % nasal spray    Sig: Place 2 sprays into both nostrils 4 (four) times daily.    Dispense:  15 mL    Refill:  12   mirtazapine (REMERON) 7.5 MG tablet    Sig: Take 1 tablet (7.5 mg total) by mouth at bedtime.    Dispense:  30 tablet  Refill:  3   methylPREDNISolone (MEDROL DOSEPAK) 4 MG TBPK tablet    Sig: Taper as directed on packaging    Dispense:  21 tablet    Refill:  0    Return in about 2 months (around 04/17/2022) for Appetite/Weight loss.    This visit occurred during the SARS-CoV-2 public health emergency.  Safety protocols were in place, including screening questions prior to the visit, additional usage of staff PPE, and extensive cleaning of exam room while observing appropriate contact time as indicated for disinfecting solutions.

## 2022-02-14 NOTE — Assessment & Plan Note (Signed)
Management per cardiology.  Continues on flecainide with Eliquis for anticoagulation.

## 2022-02-14 NOTE — Assessment & Plan Note (Signed)
Likely related to chronic use.  May continue saline rinses.  We will add trial of Atrovent nasal spray as well.

## 2022-02-14 NOTE — Assessment & Plan Note (Signed)
Ammonium lactate sent in.

## 2022-03-08 ENCOUNTER — Other Ambulatory Visit: Payer: Self-pay | Admitting: Family Medicine

## 2022-04-17 ENCOUNTER — Ambulatory Visit (INDEPENDENT_AMBULATORY_CARE_PROVIDER_SITE_OTHER): Payer: Medicare Other | Admitting: Family Medicine

## 2022-04-17 ENCOUNTER — Encounter: Payer: Self-pay | Admitting: Family Medicine

## 2022-04-17 DIAGNOSIS — J9621 Acute and chronic respiratory failure with hypoxia: Secondary | ICD-10-CM

## 2022-04-17 DIAGNOSIS — R634 Abnormal weight loss: Secondary | ICD-10-CM

## 2022-04-17 DIAGNOSIS — I482 Chronic atrial fibrillation, unspecified: Secondary | ICD-10-CM

## 2022-04-17 NOTE — Assessment & Plan Note (Signed)
History of COPD as well as lung cancer.  Remains O2 dependent.  Has upcoming CT scan.  Follow-up pulmonology.

## 2022-04-17 NOTE — Progress Notes (Signed)
[name] - 81 y.o. male MRN [id number]  Date of birth: Jul 24, 1941  Subjective Chief Complaint  Patient presents with   unintentional weight loss   Cerumen Impaction    HPI [name] is a  81 y.o. male here today for follow up visit.   He has had difficulty with gaining weight. Appetite still remains decreased.  He never started mirtazapine.  Concerned about this being and anti-depressant.  He has history of lung cancer and COPD resulting in chronic respiratory failure.  He has upcoming follow up CT scan and visit with pulmonology.    He does continue to see cardiology as well for history of A. Fib with CHF.  Stable with diltiazem and flecainide.  He is anticoagulated with apixaban.    ROS:  A comprehensive ROS was completed and negative except as noted per HPI  Allergies  Allergen Reactions   Celecoxib Shortness Of Breath   Prednisone Shortness Of Breath   Sulfamethoxazole-Trimethoprim Shortness Of Breath   Tiotropium Bromide Monohydrate Other (See Comments)    Caused prostate issues, could not void.     Antihistamines, Chlorpheniramine-Type Other (See Comments)    Caused prostate issues, could not void.   All antihistamines - "crawling inside"   Antihistamines, Diphenhydramine-Type Other (See Comments)    Keeps patient awake   Codeine Itching    Nightmares, feels itchy inside-per patient   Morphine Other (See Comments)    Unknown, possible itchy feelings inside, nightmares   Other Itching    Surgical Tape   Tiotropium Other (See Comments)   Tape Rash    Past Medical History:  Diagnosis Date   Adenocarcinoma ([place])    RUL   Cancer ([place])    lung ca 2005   COPD (chronic obstructive pulmonary disease) (HCC)    PNA (pneumonia)    as a child and 2004   Prostate enlargement     Past Surgical History:  Procedure Laterality Date   APPENDECTOMY     cyst on wrist     LAPAROSCOPIC CHOLECYSTECTOMY     Left inguinal herniorrhaphy.     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   Occupation: Environmental consultant in [employer]  Tobacco Use   Smoking status: Former    Packs/day: 2.50    Years: 45.00    Total pack years: 112.50    Types: Cigarettes    Quit date: 09/09/2002    Years since quitting: 19.6   Smokeless tobacco: Never  Substance and Sexual Activity   Alcohol use: Yes    Alcohol/week: 1.0 standard drink of alcohol    Types: 1 Cans of beer per week   Drug use: No   Sexual activity: Not Currently  Other Topics Concern   Not on file  Social History Narrative   Not on file   Social Determinants of Health   Financial Resource Strain: Not on file  Food Insecurity: Not on file  Transportation Needs: Not on file  Physical Activity: Not on file  Stress: Not on file  Social Connections: Not on file    Family History  Problem Relation Age of Onset   Asthma Son     Health Maintenance  Topic Date Due   COVID-19 Vaccine (3 - Pfizer risk series) 05/17/2022 (Originally 10/21/2019)   Zoster Vaccines- Shingrix (1 of 2) 05/17/2022 (Originally 04/24/1961)   INFLUENZA VACCINE  10/29/2022 (Originally 02/28/2022)   TETANUS/TDAP  02/15/2023 (Originally 09/09/2021)   Hepatitis C Screening  02/15/2023 (Originally 04/24/1960)   Pneumonia Vaccine 57+ Years old  Completed   HPV VACCINES  Aged Out     ----------------------------------------------------------------------------------------------------------------------------------------------------------------------------------------------------------------- Physical Exam BP (!) 103/53 (BP Location: Left Arm, Patient Position: Sitting, Cuff  Size: Small)   Pulse 66   Ht 5\' 6"  (1.676 m)   Wt 111 lb (50.3 kg)   SpO2 95%   BMI 17.92 kg/m   Physical Exam Constitutional:      Comments: Thin male  Oxygen in place.  Mentating  Eyes:     General: No scleral icterus. Cardiovascular:     Rate and Rhythm: Normal rate and regular rhythm.  Pulmonary:     Breath sounds: Normal breath sounds.     Comments: Slightly increased work of breathing with exertion. Musculoskeletal:     Cervical back: Neck supple.  Neurological:     Mental Status: He is alert.  Psychiatric:        Mood and Affect: Mood normal.        Behavior: Behavior normal.     ------------------------------------------------------------------------------------------------------------------------------------------------------------------------------------------------------------------- Assessment and Plan  Chronic atrial fibrillation (HCC) Continues on flecainide and Eliquis.  Stable at this time.  He will continue management per cardiology.  Acute and chronic respiratory failure with hypoxia (HCC) History of COPD as well as lung cancer.  Remains O2 dependent.  Has upcoming CT scan.  Follow-up pulmonology.  Weight loss Continues to have poor appetite.  Does not want to try mirtazapine to help with this.  Encouraged increased caloric intake.  He does have Ensure from the [place].   No orders of the defined types were placed in this encounter.   Return in about 4 months (around 08/17/2022) for COPD/Weight .    This visit occurred during the SARS-CoV-2 public health emergency.  Safety protocols were in place, including screening questions prior to the visit, additional usage of staff PPE, and extensive cleaning of exam room while observing appropriate contact time as indicated for disinfecting solutions.

## 2022-04-17 NOTE — Patient Instructions (Addendum)
I would try the Ensure again.  Consider the mirtazapine to help with appetite.  Follow up in 4 months.

## 2022-04-17 NOTE — Assessment & Plan Note (Signed)
Continues on flecainide and Eliquis.  Stable at this time.  He will continue management per cardiology.

## 2022-04-17 NOTE — Assessment & Plan Note (Signed)
Continues to have poor appetite.  Does not want to try mirtazapine to help with this.  Encouraged increased caloric intake.  He does have Ensure from the [place].

## 2022-08-17 ENCOUNTER — Ambulatory Visit (INDEPENDENT_AMBULATORY_CARE_PROVIDER_SITE_OTHER): Payer: Medicare Other

## 2022-08-17 ENCOUNTER — Ambulatory Visit (INDEPENDENT_AMBULATORY_CARE_PROVIDER_SITE_OTHER): Payer: Medicare Other | Admitting: Family Medicine

## 2022-08-17 ENCOUNTER — Encounter: Payer: Self-pay | Admitting: Family Medicine

## 2022-08-17 VITALS — BP 125/68 | HR 83 | Ht 66.0 in | Wt 113.0 lb

## 2022-08-17 DIAGNOSIS — J9621 Acute and chronic respiratory failure with hypoxia: Secondary | ICD-10-CM

## 2022-08-17 DIAGNOSIS — C801 Malignant (primary) neoplasm, unspecified: Secondary | ICD-10-CM | POA: Insufficient documentation

## 2022-08-17 DIAGNOSIS — I482 Chronic atrial fibrillation, unspecified: Secondary | ICD-10-CM | POA: Diagnosis not present

## 2022-08-17 DIAGNOSIS — J449 Chronic obstructive pulmonary disease, unspecified: Secondary | ICD-10-CM

## 2022-08-17 DIAGNOSIS — R059 Cough, unspecified: Secondary | ICD-10-CM | POA: Diagnosis not present

## 2022-08-17 DIAGNOSIS — R0600 Dyspnea, unspecified: Secondary | ICD-10-CM | POA: Diagnosis not present

## 2022-08-17 DIAGNOSIS — R634 Abnormal weight loss: Secondary | ICD-10-CM

## 2022-08-17 DIAGNOSIS — R918 Other nonspecific abnormal finding of lung field: Secondary | ICD-10-CM | POA: Diagnosis not present

## 2022-08-17 DIAGNOSIS — R062 Wheezing: Secondary | ICD-10-CM | POA: Diagnosis not present

## 2022-08-17 MED ORDER — METHYLPREDNISOLONE 4 MG PO TBPK: ORAL_TABLET | ORAL | 0 refills | Status: DC

## 2022-08-17 MED ORDER — DOXYCYCLINE HYCLATE 100 MG PO TABS: 100.0000 mg | ORAL_TABLET | Freq: Two times a day (BID) | ORAL | 0 refills | Status: AC

## 2022-08-17 NOTE — Progress Notes (Signed)
[name] - 82 y.o. male MRN [id number]  Date of birth: Oct 29, 1940  Subjective Chief Complaint  Patient presents with   COPD    HPI [name] is an 82 year old male here today for follow-up visit.  He does have care through the [place] as well.  History of lung cancer and COPD.  He reports some increased dyspnea, fatigue and wheezing over the past week or 2.  He denies chest pain, fever or chills.  He does remain on chronic O2.  Continues to have decreased appetite.  [facility] however [facility] never [facility] this.  We did try adding mirtazapine previously, he is unsure if he is taking this.  Weight is stable today compared to previous weight.  He does continue to have serial abdominal CT for follow-up of retroperitoneal mass.  They have opted for surveillance due to inability to biopsy because of his comorbidities.  He continues to see cardiology through the [facility] for management of A-fib.  Remains on diltiazem, flecainide and Eliquis.  No side effects of medication that he has noticed.  ROS:  A comprehensive ROS was completed and negative except as noted per HPI  Allergies  Allergen Reactions   Celecoxib Shortness Of Breath   Prednisone Shortness Of Breath   Sulfamethoxazole-Trimethoprim Shortness Of Breath   Tiotropium Bromide Monohydrate Other (See Comments)    Caused prostate issues, could not void.     Antihistamines, Chlorpheniramine-Type Other (See Comments)    Caused prostate issues, could not void.   All antihistamines - "crawling inside"   Budeson-Glycopyrrol-Formoterol Other (See Comments)    Breathing is worse,blurred vision, difficulty urinating, unable to sleep, nervousness,"heavy heartbeat."   Antihistamines, Diphenhydramine-Type Other (See Comments)    Keeps patient awake   Codeine Itching    Nightmares, feels itchy inside-per patient   Morphine Other (See Comments)    Unknown, possible itchy feelings inside, nightmares   Other Itching    Surgical Tape    Tiotropium Other (See Comments)   Tape Rash    Past Medical History:  Diagnosis Date   Adenocarcinoma (HCC)    RUL   Cancer (HCC)    lung ca 2005   COPD (chronic obstructive pulmonary disease) (HCC)    PNA (pneumonia)    as a child and 2004   Prostate enlargement     Past Surgical History:  Procedure Laterality Date   APPENDECTOMY     cyst on wrist     LAPAROSCOPIC CHOLECYSTECTOMY     Left inguinal herniorrhaphy.     LOBECTOMY     RUL 2005 no adjuvant   TONSILLECTOMY     Transurethral resection of prostate (Gyrus vaporization)      Social History   Socioeconomic History   Marital status: Divorced    Spouse name: Not on file   Number of children: Not on file   Years of education: Not on file   Highest education level: Not on file  Occupational History   Occupation: buyers for an Midwife firm    Employer: RETIRED   Occupation: Geophysicist/field seismologist in [employer]  Tobacco Use   Smoking status: Former    Packs/day: 2.50    Years: 45.00    Total pack years: 112.50    Types: Cigarettes    Quit date: 09/09/2002    Years since quitting: 19.9   Smokeless tobacco: Never  Substance and Sexual Activity   Alcohol use: Yes    Alcohol/week: 1.0 standard drink of alcohol  Types: 1 Cans of beer per week   Drug use: No   Sexual activity: Not Currently  Other Topics Concern   Not on file  Social History Narrative   Not on file   Social Determinants of Health   Financial Resource Strain: Not on file  Food Insecurity: Not on file  Transportation Needs: Not on file  Physical Activity: Not on file  Stress: Not on file  Social Connections: Not on file    Family History  Problem Relation Age of Onset   Asthma Son     Health Maintenance  Topic Date Due   DTaP/Tdap/Td (2 - Td or Tdap) 09/09/2021   Medicare Annual Wellness (AWV)  10/16/2022 (Originally 12-05-1940)   Zoster Vaccines- Shingrix (1 of 2) 11/16/2022 (Originally 04/24/1960)   COVID-19 Vaccine (4 - 2023-24 season)  09/03/2023 (Originally 03/31/2022)   Pneumonia Vaccine 49+ Years old  Completed   INFLUENZA VACCINE  Completed   HPV VACCINES  Aged Out     ----------------------------------------------------------------------------------------------------------------------------------------------------------------------------------------------------------------- Physical Exam BP 125/68 (BP Location: Left Arm, Patient Position: Sitting, Cuff Size: Normal)   Pulse 83   Ht 5\' 6"  (1.676 m)   Wt 113 lb (51.3 kg)   SpO2 95%   BMI 18.24 kg/m   Physical Exam Constitutional:      Appearance: Normal appearance.  HENT:     Head: Normocephalic and atraumatic.  Eyes:     General: No scleral icterus. Cardiovascular:     Rate and Rhythm: Normal rate and regular rhythm.  Pulmonary:     Effort: Pulmonary effort is normal.     Comments: Decreased air movement bilaterally.  Improved after albuterol treatment however still with scattered wheezing. Musculoskeletal:     Cervical back: Neck supple.  Neurological:     Mental Status: He is alert.  Psychiatric:        Mood and Affect: Mood normal.        Behavior: Behavior normal.     ------------------------------------------------------------------------------------------------------------------------------------------------------------------------------------------------------------------- Assessment and Plan  Chronic atrial fibrillation (HCC) Continue to see cardiology for management.  Stable at this time with current medications.  Acute and chronic respiratory failure with hypoxia (HCC) He remains on chronic oxygen.  He has had worsening dyspnea recently with some increased wheezing on exam.  Given albuterol nebulizer treatment today with better air movement.  Chest x-ray ordered.  Adding doxycycline and Medrol Dosepak.  Given instructions to seek emergency care if having significantly worsening symptoms.  Weight loss Continues to have decreased appetite.   [facility], he will have records from neck scan sent over.  He did not want to take Megace.  He will check to see if he has mirtazapine at home.   Meds ordered this encounter  Medications   methylPREDNISolone (MEDROL DOSEPAK) 4 MG TBPK tablet    Sig: Taper as directed on packaging    Dispense:  21 tablet    Refill:  0   doxycycline (VIBRA-TABS) 100 MG tablet    Sig: Take 1 tablet (100 mg total) by mouth 2 (two) times daily for 10 days.    Dispense:  20 tablet    Refill:  0    No follow-ups on file.    This visit occurred during the SARS-CoV-2 public health emergency.  Safety protocols were in place, including screening questions prior to the visit, additional usage of staff PPE, and extensive cleaning of exam room while observing appropriate contact time as indicated for disinfecting solutions.

## 2022-08-18 ENCOUNTER — Other Ambulatory Visit: Payer: Self-pay | Admitting: Family Medicine

## 2022-08-18 MED ORDER — AMOXICILLIN-POT CLAVULANATE 875-125 MG PO TABS: 1.0000 | ORAL_TABLET | Freq: Two times a day (BID) | ORAL | 0 refills | Status: DC

## 2022-08-20 ENCOUNTER — Encounter: Payer: Self-pay | Admitting: Family Medicine

## 2022-08-20 NOTE — Assessment & Plan Note (Signed)
Continues to have decreased appetite.  [facility], he will have records from neck scan sent over.  He did not want to take Megace.  He will check to see if he has mirtazapine at home.

## 2022-08-20 NOTE — Assessment & Plan Note (Signed)
He remains on chronic oxygen.  He has had worsening dyspnea recently with some increased wheezing on exam.  Given albuterol nebulizer treatment today with better air movement.  Chest x-ray ordered.  Adding doxycycline and Medrol Dosepak.  Given instructions to seek emergency care if having significantly worsening symptoms.

## 2022-08-20 NOTE — Assessment & Plan Note (Signed)
Continue to see cardiology for management.  Stable at this time with current medications.

## 2022-09-11 ENCOUNTER — Telehealth: Payer: Self-pay

## 2022-09-11 NOTE — Telephone Encounter (Signed)
Pt lvm stating he did not receive Chest xray results and was concerned about the medications he'd received.   Returned the patient's call. Call went directly to VM. Noted several phone messages. Contacted the patient using cell phone. Pt had blocked the office phone number and was unaware we were attempting to contact him.   Advised patient to contact his Cardiology providers concerning increased AFib. Scheduled virtual appt on Wednesday with Dr. Zigmund Daniel for fatigue.

## 2022-09-13 ENCOUNTER — Encounter: Payer: Self-pay | Admitting: Family Medicine

## 2022-09-13 ENCOUNTER — Telehealth (INDEPENDENT_AMBULATORY_CARE_PROVIDER_SITE_OTHER): Payer: Medicare Other | Admitting: Family Medicine

## 2022-09-13 VITALS — Ht 66.0 in | Wt 108.0 lb

## 2022-09-13 DIAGNOSIS — R0602 Shortness of breath: Secondary | ICD-10-CM

## 2022-09-13 DIAGNOSIS — I482 Chronic atrial fibrillation, unspecified: Secondary | ICD-10-CM | POA: Diagnosis not present

## 2022-09-13 DIAGNOSIS — R0600 Dyspnea, unspecified: Secondary | ICD-10-CM

## 2022-09-13 NOTE — Assessment & Plan Note (Signed)
He has noticed more palpitations.  He will let me know if unable to get in touch with his cardiologist over the next day or 2.  We discussed red flags including continued worsening of symptoms with low threshold to seek emergency care.

## 2022-09-13 NOTE — Progress Notes (Signed)
[facility]. O2 decreases with walking or any form of exertion. Cardiologist started him on potassium 7meq. Only taking 31meq due to O2 being affected.  2/7:  120/69a; 113/69p 2/8:  113/75a; 108/65p 2/9:  108/47a; 114/60p 2/10:  136/69a; 112/59p 2/11:  119/59a; 116/68p 2/12:  124/61a; 105/59p 2/14:  117/78a  97%O2; 88bpm  States Ensure affects his breathing. Dietician was supposed to send Boost. Instead he received a package of Ensure clear apple. "It's the nastiest thing I've ever put in my mouth"  Having CT scan on Friday. 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

## 2022-09-13 NOTE — Assessment & Plan Note (Signed)
Acute on chronic dyspnea.  Recently treated for pneumonia.  We discussed repeating chest x-ray to be sure this is resolving.  He does report that he has a CT of his abdomen and chest on Friday and would prefer to wait and just have this completed.

## 2022-09-13 NOTE — Progress Notes (Signed)
[name] - 82 y.o. male MRN [id number]  Date of birth: 1941-02-26   This visit type was conducted due to national recommendations for restrictions regarding the COVID-19 Pandemic (e.g. social distancing).  This format is felt to be most appropriate for this patient at this time.  All issues noted in this document were discussed and addressed.  No physical exam was performed (except for noted visual exam findings with Video Visits).  I discussed the limitations of evaluation and management by telemedicine and the availability of in person appointments. The patient expressed understanding and agreed to proceed.  I connected withNAME@ on 09/13/22 at 11:30 AM EST by a video enabled telemedicine application and verified that I am speaking with the correct person using two identifiers.  Present at visit: Luetta Nutting, DO Dava Najjar   Patient Location: Home [address] [place] [address]   Provider location:   [facility]  Chief Complaint  Patient presents with   Shortness of Breath    HPI  [name] is a 82 y.o. male who presents via audio/video conferencing for a telehealth visit today.  Reports that he continues to have dyspnea and fatigue.  Recently treated with combination of azithromycin and Augmentin for pneumonia.  He does state that this seemed to help for short period of time however most recently has been more palpitations and thinks that his shortness of breath may be related to his A-fib.  He has reduced his potassium supplementation as he reports that he read online that this can affect your breathing.  He has tried to reach out to his cardiologist through the [place] but has not had a return call over the past few days.  He has not had to increase his oxygen requirement.  Heart rate has been in the 80s to 90s.  Blood pressures been stable.  O2 sats have been normal on his regular oxygen flow.  ROS:  A comprehensive ROS was completed and negative except as noted  per HPI  Past Medical History:  Diagnosis Date   Adenocarcinoma ([place])    RUL   Cancer ([place])    lung ca 2005   COPD (chronic obstructive pulmonary disease) (HCC)    PNA (pneumonia)    as a child and 2004   Prostate enlargement     Past Surgical History:  Procedure Laterality Date   APPENDECTOMY     cyst on wrist     LAPAROSCOPIC CHOLECYSTECTOMY     Left inguinal herniorrhaphy.     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    Employer: RETIRED   Occupation: Environmental consultant in [employer]  Tobacco Use   Smoking status: Former    Packs/day: 2.50    Years: 45.00    Total pack years: 112.50    Types: Cigarettes    Quit date: 09/09/2002    Years since quitting: 20.0   Smokeless tobacco: Never  Substance and Sexual Activity   Alcohol use: Yes  Alcohol/week: 1.0 standard drink of alcohol    Types: 1 Cans of beer per week   Drug use: No   Sexual activity: Not Currently  Other Topics Concern   Not on file  Social History Narrative   Not on file   Social Determinants of Health   Financial Resource Strain: Not on file  Food Insecurity: Not on file  Transportation Needs: Not on file  Physical Activity: Not on file  Stress: Not on file  Social Connections: Not on file  Intimate Partner Violence: Not on file     Current Outpatient Medications:    albuterol (PROAIR HFA) 108 (90 BASE) MCG/ACT inhaler, Inhale 2 puffs into the lungs 4 (four) times daily.  , Disp: , Rfl:    albuterol (PROVENTIL) (2.5 MG/3ML) 0.083% nebulizer solution, USE 1 VIAL IN NEBULIZER 4 TIMES A  DAY AS NEEDED FOR WHEEZING, Disp: 1080 mL, Rfl: 0   ammonium lactate (AMLACTIN) 12 % lotion, Apply 1 Application topically as needed for dry skin., Disp: 400 g, Rfl: 0   amoxicillin-clavulanate (AUGMENTIN) 875-125 MG tablet, Take 1 tablet by mouth 2 (two) times daily., Disp: 20 tablet, Rfl: 0   apixaban (ELIQUIS) 5 MG TABS tablet, TAKE ONE TABLET BY MOUTH TWICE A DAY (CAUTION: BLOOD THINNER), Disp: , Rfl:    diltiazem (TIAZAC) 240 MG 24 hr capsule, TAKE ONE CAPSULE BY MOUTH DAILY FOR HEART, Disp: , Rfl:    finasteride (PROSCAR) 5 MG tablet, Take 5 mg by mouth daily., Disp: , Rfl:    flecainide (TAMBOCOR) 100 MG tablet, Take by mouth., Disp: , Rfl:    glucosamine-chondroitin 500-400 MG tablet, Take 1 tablet by mouth daily.  , Disp: , Rfl:    guaifenesin (HUMIBID E) 400 MG TABS tablet, TAKE ONE TABLET BY MOUTH EVERY FOUR HOURS AS NEEDED - EXPECTORANT, Disp: , Rfl:    Humidifiers (COOL MIST HUMIDIFIER) MISC, Use as directed, Disp: 1 each, Rfl: 0   hydrocortisone (ANUSOL-HC) 2.5 % rectal cream, Place 1 application rectally daily., Disp: , Rfl:    ipratropium (ATROVENT) 0.06 % nasal spray, Place 2 sprays into both nostrils 4 (four) times daily., Disp: 15 mL, Rfl: 12   lidocaine (LIDODERM) 5 %, Place 1 patch onto the skin daily., Disp: , Rfl:    methylPREDNISolone (MEDROL DOSEPAK) 4 MG TBPK tablet, Taper as directed on packaging, Disp: 21 tablet, Rfl: 0   mirtazapine (REMERON) 7.5 MG tablet, Take 1 tablet (7.5 mg total) by mouth at bedtime., Disp: 30 tablet, Rfl: 3   mometasone-formoterol (DULERA) 200-5 MCG/ACT AERO, Inhale 2 puffs into the lungs 2 (two) times daily., Disp: 1 Inhaler, Rfl: 0   omeprazole (PRILOSEC) 20 MG capsule, Take 20 mg by mouth Daily. , Disp: , Rfl:    OXYGEN, Inhale into the lungs., Disp: , Rfl:    potassium chloride SA (KLOR-CON M) 20 MEQ tablet, Take 20 mEq by mouth 2 (two) times daily., Disp: , Rfl:    Tamsulosin HCl (FLOMAX) 0.4 MG CAPS, Take 0.4 mg by mouth Twice daily. ,  Disp: , Rfl:   EXAM:  VITALS per patient if applicable: Ht 5\' 6"  (1.676 m)   Wt 108 lb (49 kg)   BMI 17.43 kg/m   GENERAL: alert, oriented, appears well and in no acute distress  HEENT: atraumatic, conjunttiva clear, no obvious abnormalities on inspection of external nose and ears  NECK: normal movements of the head and neck  LUNGS: O2 via nasal cannula in place.  CV: no obvious cyanosis  MS: moves all visible extremities without noticeable abnormality    ASSESSMENT AND PLAN:  Discussed the following assessment and plan:  Chronic atrial fibrillation ([place]) He has noticed more palpitations.  He will let me know if unable to get in touch with his cardiologist over the next day or 2.  We discussed red flags including continued worsening of symptoms with low threshold to seek emergency care.  DYSPNEA Acute on chronic dyspnea.  Recently treated for pneumonia.  We discussed repeating chest x-ray to be sure this is resolving.  He does report that he has a CT of his abdomen and chest on Friday and would prefer to wait and just have this completed.     I discussed the assessment and treatment plan with the patient. The patient was provided an opportunity to ask questions and all were answered. The patient agreed with the plan and demonstrated an understanding of the instructions.   The patient was advised to call back or seek an in-person evaluation if the symptoms worsen or if the condition fails to improve as anticipated.    Luetta Nutting, DO

## 2022-10-04 ENCOUNTER — Telehealth: Payer: Self-pay

## 2022-10-04 DIAGNOSIS — J449 Chronic obstructive pulmonary disease, unspecified: Secondary | ICD-10-CM

## 2022-10-04 MED ORDER — METHYLPREDNISOLONE 4 MG PO TBPK: ORAL_TABLET | ORAL | 0 refills | Status: DC

## 2022-10-04 NOTE — Telephone Encounter (Signed)
Patient lvm requesting prednisone Rx concerning difficulty breathing.   Last virtual visit: 09/13/2022

## 2022-10-04 NOTE — Telephone Encounter (Signed)
Pt states that his breathing has not recovered since having pneumonia. He's weak and his breathing is difficult. Wearing 2L continuous O2. States "increasing the O2 makes it harder for the CO2 to come out".   He was at the [place] yesterday. He used to be on continual prednisone for exacerbations (per patient). If he goes to the [place] they make him wait outside for a wheelchair and then the appt takes 3-4 hours. When Dr. Zigmund Daniel calls in a prescription, he can get in his car, go thru the drive thru, and be home taking his medicine.  Please advise. Pt states he's been waiting for a response since Monday.

## 2022-10-05 NOTE — Telephone Encounter (Signed)
Spoke to patient today. He received the medication last night.

## 2022-10-05 NOTE — Telephone Encounter (Signed)
LVM advising patient that medication was called in and healthcare recommendations based on symptoms worsening.

## 2022-10-18 ENCOUNTER — Telehealth: Payer: Self-pay | Admitting: Family Medicine

## 2022-10-18 NOTE — Telephone Encounter (Signed)
Called patient to schedule Medicare Annual Wellness Visit (AWV). Left message for patient to call back and schedule Medicare Annual Wellness Visit (AWV).  Last date of AWV: Never  Please schedule an appointment at any time with Nurse Health Advisor.  If any questions, please contact me at [phone].  Thank you ,  Morgan Jessup Patient Access Advocate II Direct Dial: [phone]   

## 2022-12-04 DIAGNOSIS — Z79899 Other long term (current) drug therapy: Secondary | ICD-10-CM | POA: Diagnosis not present

## 2022-12-04 DIAGNOSIS — J449 Chronic obstructive pulmonary disease, unspecified: Secondary | ICD-10-CM | POA: Diagnosis not present

## 2022-12-04 DIAGNOSIS — J9611 Chronic respiratory failure with hypoxia: Secondary | ICD-10-CM | POA: Diagnosis not present

## 2022-12-04 DIAGNOSIS — I482 Chronic atrial fibrillation, unspecified: Secondary | ICD-10-CM | POA: Diagnosis not present

## 2022-12-04 DIAGNOSIS — D696 Thrombocytopenia, unspecified: Secondary | ICD-10-CM | POA: Diagnosis not present

## 2022-12-04 DIAGNOSIS — Z882 Allergy status to sulfonamides status: Secondary | ICD-10-CM | POA: Diagnosis not present

## 2022-12-04 DIAGNOSIS — D649 Anemia, unspecified: Secondary | ICD-10-CM | POA: Diagnosis not present

## 2022-12-04 DIAGNOSIS — Z888 Allergy status to other drugs, medicaments and biological substances status: Secondary | ICD-10-CM | POA: Diagnosis not present

## 2022-12-04 DIAGNOSIS — R0602 Shortness of breath: Secondary | ICD-10-CM | POA: Diagnosis not present

## 2022-12-04 DIAGNOSIS — Z87891 Personal history of nicotine dependence: Secondary | ICD-10-CM | POA: Diagnosis not present

## 2022-12-04 DIAGNOSIS — Z85118 Personal history of other malignant neoplasm of bronchus and lung: Secondary | ICD-10-CM | POA: Diagnosis not present

## 2022-12-04 DIAGNOSIS — Z9089 Acquired absence of other organs: Secondary | ICD-10-CM | POA: Diagnosis not present

## 2022-12-04 DIAGNOSIS — Z880 Allergy status to penicillin: Secondary | ICD-10-CM | POA: Diagnosis not present

## 2022-12-04 DIAGNOSIS — Z66 Do not resuscitate: Secondary | ICD-10-CM | POA: Diagnosis not present

## 2022-12-04 DIAGNOSIS — Z7951 Long term (current) use of inhaled steroids: Secondary | ICD-10-CM | POA: Diagnosis not present

## 2022-12-04 DIAGNOSIS — J439 Emphysema, unspecified: Secondary | ICD-10-CM | POA: Diagnosis not present

## 2022-12-04 DIAGNOSIS — Z885 Allergy status to narcotic agent status: Secondary | ICD-10-CM | POA: Diagnosis not present

## 2022-12-04 DIAGNOSIS — R531 Weakness: Secondary | ICD-10-CM | POA: Diagnosis not present

## 2022-12-04 DIAGNOSIS — R7989 Other specified abnormal findings of blood chemistry: Secondary | ICD-10-CM | POA: Diagnosis not present

## 2022-12-04 DIAGNOSIS — R0609 Other forms of dyspnea: Secondary | ICD-10-CM | POA: Diagnosis not present

## 2022-12-04 DIAGNOSIS — R600 Localized edema: Secondary | ICD-10-CM | POA: Diagnosis not present

## 2022-12-04 DIAGNOSIS — I4891 Unspecified atrial fibrillation: Secondary | ICD-10-CM | POA: Diagnosis not present

## 2022-12-04 DIAGNOSIS — Z7901 Long term (current) use of anticoagulants: Secondary | ICD-10-CM | POA: Diagnosis not present

## 2022-12-04 DIAGNOSIS — Z9841 Cataract extraction status, right eye: Secondary | ICD-10-CM | POA: Diagnosis not present

## 2022-12-04 DIAGNOSIS — I10 Essential (primary) hypertension: Secondary | ICD-10-CM | POA: Diagnosis not present

## 2022-12-04 DIAGNOSIS — Z1152 Encounter for screening for COVID-19: Secondary | ICD-10-CM | POA: Diagnosis not present

## 2022-12-04 DIAGNOSIS — Z961 Presence of intraocular lens: Secondary | ICD-10-CM | POA: Diagnosis not present

## 2022-12-04 DIAGNOSIS — Z881 Allergy status to other antibiotic agents status: Secondary | ICD-10-CM | POA: Diagnosis not present

## 2022-12-07 ENCOUNTER — Encounter: Payer: Self-pay | Admitting: *Deleted

## 2022-12-07 ENCOUNTER — Telehealth: Payer: Self-pay | Admitting: *Deleted

## 2022-12-07 NOTE — Transitions of Care (Post Inpatient/ED Visit) (Signed)
12/07/2022  Name: RONDY OAKMAN MRN: [id number] DOB: [birth year]  Today's TOC FU Call Status: Today's TOC FU Call Status:: Successful TOC FU Call Competed TOC FU Call Complete Date: 12/07/22  Transition Care Management Follow-up Telephone Call Date of Discharge: 12/06/22 Discharge Facility: Other ([facility]) Name of Other ([facility]) Discharge Facility: [facility] Type of Discharge: Inpatient Admission Primary Inpatient Discharge Diagnosis:: shortness of breath/ weakness How have you been since you were released from the hospital?: Better ("Overall better; I am not going to schedule an appointment with Dr. Ashley Royalty-- he is my back up doctor-- [facility] - I will [facility] them at some point to schedule the appointment.  I only use Dr. Ashley Royalty if I can't get to the [facility] doctor") Any questions or concerns?: No  Items Reviewed: Did you receive and understand the discharge instructions provided?: Yes (briefly reviewed with patient who verbalizes good understanding of same- outside hospital discharge) Medications obtained,verified, and reconciled?: Yes (Medications Reviewed) (Partial medication review completed; confirmed patient obtained/ is taking all newly Rx'd medications as instructed; self-manages medications and denies questions/ concerns around medications today ; adamantly declines full review of medications) Any new allergies since your discharge?: No Dietary orders reviewed?: Yes Type of Diet Ordered:: "As healthy as possible" Do you have support at home?: Yes People in Home: alone Name of Support/Comfort Primary Source: Reports independent in self-care activities; local assists as/ if needed/ indicated  Medications Reviewed Today: Medications Reviewed Today     Reviewed by Michaela Corner, RN (Registered Nurse) on 12/07/22 at 1317  Med List Status: <None>   Medication Order Taking? Sig Documenting Provider Last Dose Status Informant  albuterol (PROAIR HFA) 108  (90 BASE) MCG/ACT inhaler [id number] Yes Inhale 2 puffs into the lungs 4 (four) times daily.   [provider] Taking Active Self  albuterol (PROVENTIL) (2.5 MG/3ML) 0.083% nebulizer solution [id number] Yes USE 1 VIAL IN NEBULIZER 4 TIMES A DAY AS NEEDED FOR WHEEZING Young, Clinton D, MD Taking Active   ammonium lactate (AMLACTIN) 12 % lotion [id number]  Apply 1 Application topically as needed for dry skin. Everrett Coombe, DO  Active   amoxicillin-clavulanate (AUGMENTIN) 875-125 MG tablet [id number] No Take 1 tablet by mouth 2 (two) times daily.  Patient not taking: Reported on 12/07/2022   Everrett Coombe, DO Not Taking Active            Med Note Jonnie Kind Dec 07, 2022 11:38 AM) Reports completed dose  apixaban (ELIQUIS) 5 MG TABS tablet [id number]  TAKE ONE TABLET BY MOUTH TWICE A DAY (CAUTION: BLOOD THINNER) [provider]  Active   diltiazem (TIAZAC) 240 MG 24 hr capsule [id number]  TAKE ONE CAPSULE BY MOUTH DAILY FOR HEART [provider]  Active   doxycycline (VIBRA-TABS) 100 MG tablet [id number] Yes Take 100 mg by mouth 2 (two) times daily. Everrett Coombe, DO Taking Active Self           Med Note Jonnie Kind Dec 07, 2022 11:37 AM) 12/07/22: reports ordered at time of hospital discharge from [facility] 12/06/22-- to take for 5 days  finasteride (PROSCAR) 5 MG tablet [id number]  Take 5 mg by mouth daily. [provider]  Active   flecainide (TAMBOCOR) 100 MG tablet [id number]  Take by mouth. [provider]  Active   furosemide (LASIX) 20 MG tablet [id number] Yes Take 20 mg by mouth daily. Everrett Coombe, DO Taking Active  Self           Med Note Michaela Corner   Thu Dec 07, 2022 11:31 AM) 12/07/22:  Patient reports this was prescribed by hospital discharge provider at [facility] at time of discharge on 12/06/22-- taking for 30 days only  glucosamine-chondroitin 500-400 MG tablet [id number]  Take 1 tablet by mouth daily.   [provider]  Active  Self  guaifenesin (HUMIBID E) 400 MG TABS tablet [id number]  TAKE ONE TABLET BY MOUTH EVERY FOUR HOURS AS NEEDED - EXPECTORANT [provider]  Active   Humidifiers (COOL MIST HUMIDIFIER) MISC [id number]  Use as directed Waymon Budge, MD  Active   hydrocortisone (ANUSOL-HC) 2.5 % rectal cream [id number]  Place 1 application rectally daily. [provider]  Active Self  ipratropium (ATROVENT) 0.06 % nasal spray [id number]  Place 2 sprays into both nostrils 4 (four) times daily. Everrett Coombe, DO  Active   lidocaine (LIDODERM) 5 % [id number]  Place 1 patch onto the skin daily. [provider]  Active   methylPREDNISolone (MEDROL DOSEPAK) 4 MG TBPK tablet [id number]  Taper as directed on packaging Everrett Coombe, DO  Active   mirtazapine (REMERON) 7.5 MG tablet [id number]  Take 1 tablet (7.5 mg total) by mouth at bedtime. Everrett Coombe, DO  Active   mometasone-formoterol [facility]) 200-5 MCG/ACT Sandrea Matte [id number]  Inhale 2 puffs into the lungs 2 (two) times daily. Jetty Duhamel D, MD  Active   omeprazole (PRILOSEC) 20 MG capsule [id number]  Take 20 mg by mouth Daily.  [provider]  Active Self  OXYGEN [id number] Yes Inhale into the lungs. [provider] Taking Active   potassium chloride SA (KLOR-CON M) 20 MEQ tablet [id number]  Take 20 mEq by mouth 2 (two) times daily. [provider]  Active   predniSONE (DELTASONE) 20 MG tablet [id number] Yes Take 20 mg by mouth daily with breakfast. Take (2) pills for 40 mg QD in morning x 3 days Everrett Coombe, DO Taking Active Self           Med Note Jonnie Kind Dec 07, 2022 11:40 AM) 12/07/22: Reports ordered at time of hospital discharge from [facility] on 12/06/22-- to take for 3 days  Tamsulosin HCl (FLOMAX) 0.4 MG CAPS [id number]  Take 0.4 mg by mouth Twice daily.  [provider]  Active Self            Home Care and Equipment/Supplies: Were Home Health Services Ordered?: No Any new equipment  or medical supplies ordered?: No  Functional Questionnaire: Do you need assistance with bathing/showering or dressing?: No Do you need assistance with meal preparation?: No Do you need assistance with eating?: No Do you have difficulty maintaining continence: No Do you need assistance with getting out of bed/getting out of a chair/moving?: No Do you have difficulty managing or taking your medications?: No  Follow up appointments reviewed: PCP Follow-up appointment confirmed?: No ([facility]; only uses [facility] "for emergencies or urgent needs" states he [facility]) MD Provider Line Number:[phone] Given: No (verified well-established with current PCP  at [facility] and uses Dr. Ashley Royalty at [facility] as alternative/ as needed if he can't get into the [facility] PCP office) Specialist Hospital Follow-up appointment confirmed?: NA Do you need transportation to your follow-up appointment?: No Do you understand care options if your condition(s) worsen?: Yes-patient verbalized understanding  TOC Interventions Today    Flowsheet  Row Most Recent Value  TOC Interventions   TOC Interventions Discussed/Reviewed TOC Interventions Discussed  [Patient adamantly declines need for ongoing/ further care coordination outreach,  offered to provid my direct contact information should questions/ concerns/ needs arise post-TOC call- patient again adamantly declined taking my number]       Interventions Today    Flowsheet Row Most Recent Value  Chronic Disease   Chronic disease during today's visit Congestive Heart Failure (CHF)  General Interventions   General Interventions Discussed/Reviewed General Interventions Discussed, Doctor Visits, Durable Medical Equipment (DME)  Doctor Visits Discussed/Reviewed PCP, Doctor Visits Discussed  Durable Medical Equipment (DME) Dan Humphreys, Oxygen  PCP/Specialist Visits Compliance with follow-up visit  Nutrition Interventions    Nutrition Discussed/Reviewed Nutrition Discussed  Pharmacy Interventions   Pharmacy Dicussed/Reviewed Pharmacy Topics Discussed      Caryl Pina, RN, BSN, CCRN Alumnus RN CM Care Coordination/ Transition of Care- [facility] Care Management [phone]: direct office

## 2022-12-16 DIAGNOSIS — J9690 Respiratory failure, unspecified, unspecified whether with hypoxia or hypercapnia: Secondary | ICD-10-CM | POA: Diagnosis not present

## 2022-12-16 DIAGNOSIS — Z9981 Dependence on supplemental oxygen: Secondary | ICD-10-CM | POA: Diagnosis not present

## 2022-12-16 DIAGNOSIS — R0602 Shortness of breath: Secondary | ICD-10-CM | POA: Diagnosis not present

## 2022-12-16 DIAGNOSIS — Z66 Do not resuscitate: Secondary | ICD-10-CM | POA: Diagnosis not present

## 2022-12-16 DIAGNOSIS — H04123 Dry eye syndrome of bilateral lacrimal glands: Secondary | ICD-10-CM | POA: Diagnosis not present

## 2022-12-16 DIAGNOSIS — Z85118 Personal history of other malignant neoplasm of bronchus and lung: Secondary | ICD-10-CM | POA: Diagnosis not present

## 2022-12-16 DIAGNOSIS — N289 Disorder of kidney and ureter, unspecified: Secondary | ICD-10-CM | POA: Diagnosis not present

## 2022-12-16 DIAGNOSIS — J984 Other disorders of lung: Secondary | ICD-10-CM | POA: Diagnosis not present

## 2022-12-16 DIAGNOSIS — E8770 Fluid overload, unspecified: Secondary | ICD-10-CM | POA: Diagnosis not present

## 2022-12-16 DIAGNOSIS — Z87891 Personal history of nicotine dependence: Secondary | ICD-10-CM | POA: Diagnosis not present

## 2022-12-16 DIAGNOSIS — M6284 Sarcopenia: Secondary | ICD-10-CM | POA: Diagnosis not present

## 2022-12-16 DIAGNOSIS — J309 Allergic rhinitis, unspecified: Secondary | ICD-10-CM | POA: Diagnosis not present

## 2022-12-16 DIAGNOSIS — E440 Moderate protein-calorie malnutrition: Secondary | ICD-10-CM | POA: Diagnosis not present

## 2022-12-16 DIAGNOSIS — E876 Hypokalemia: Secondary | ICD-10-CM | POA: Diagnosis not present

## 2022-12-16 DIAGNOSIS — R627 Adult failure to thrive: Secondary | ICD-10-CM | POA: Diagnosis not present

## 2022-12-16 DIAGNOSIS — I872 Venous insufficiency (chronic) (peripheral): Secondary | ICD-10-CM | POA: Diagnosis not present

## 2022-12-16 DIAGNOSIS — J9611 Chronic respiratory failure with hypoxia: Secondary | ICD-10-CM | POA: Diagnosis not present

## 2022-12-16 DIAGNOSIS — R339 Retention of urine, unspecified: Secondary | ICD-10-CM | POA: Diagnosis not present

## 2022-12-16 DIAGNOSIS — Z743 Need for continuous supervision: Secondary | ICD-10-CM | POA: Diagnosis not present

## 2022-12-16 DIAGNOSIS — Z602 Problems related to living alone: Secondary | ICD-10-CM | POA: Diagnosis not present

## 2022-12-16 DIAGNOSIS — R600 Localized edema: Secondary | ICD-10-CM | POA: Diagnosis not present

## 2022-12-16 DIAGNOSIS — K820 Obstruction of gallbladder: Secondary | ICD-10-CM | POA: Diagnosis not present

## 2022-12-16 DIAGNOSIS — E46 Unspecified protein-calorie malnutrition: Secondary | ICD-10-CM | POA: Diagnosis not present

## 2022-12-16 DIAGNOSIS — D696 Thrombocytopenia, unspecified: Secondary | ICD-10-CM | POA: Diagnosis not present

## 2022-12-16 DIAGNOSIS — Z7409 Other reduced mobility: Secondary | ICD-10-CM | POA: Diagnosis not present

## 2022-12-16 DIAGNOSIS — D638 Anemia in other chronic diseases classified elsewhere: Secondary | ICD-10-CM | POA: Diagnosis not present

## 2022-12-16 DIAGNOSIS — K5900 Constipation, unspecified: Secondary | ICD-10-CM | POA: Diagnosis not present

## 2022-12-16 DIAGNOSIS — Z681 Body mass index (BMI) 19 or less, adult: Secondary | ICD-10-CM | POA: Diagnosis not present

## 2022-12-16 DIAGNOSIS — E8779 Other fluid overload: Secondary | ICD-10-CM | POA: Diagnosis not present

## 2022-12-16 DIAGNOSIS — I739 Peripheral vascular disease, unspecified: Secondary | ICD-10-CM | POA: Diagnosis not present

## 2022-12-16 DIAGNOSIS — Z79899 Other long term (current) drug therapy: Secondary | ICD-10-CM | POA: Diagnosis not present

## 2022-12-16 DIAGNOSIS — D6959 Other secondary thrombocytopenia: Secondary | ICD-10-CM | POA: Diagnosis not present

## 2022-12-16 DIAGNOSIS — R338 Other retention of urine: Secondary | ICD-10-CM | POA: Diagnosis not present

## 2022-12-16 DIAGNOSIS — J449 Chronic obstructive pulmonary disease, unspecified: Secondary | ICD-10-CM | POA: Diagnosis not present

## 2022-12-16 DIAGNOSIS — Z7901 Long term (current) use of anticoagulants: Secondary | ICD-10-CM | POA: Diagnosis not present

## 2022-12-16 DIAGNOSIS — D649 Anemia, unspecified: Secondary | ICD-10-CM | POA: Diagnosis not present

## 2022-12-16 DIAGNOSIS — J439 Emphysema, unspecified: Secondary | ICD-10-CM | POA: Diagnosis not present

## 2022-12-16 DIAGNOSIS — K219 Gastro-esophageal reflux disease without esophagitis: Secondary | ICD-10-CM | POA: Diagnosis not present

## 2022-12-16 DIAGNOSIS — I480 Paroxysmal atrial fibrillation: Secondary | ICD-10-CM | POA: Diagnosis not present

## 2022-12-16 DIAGNOSIS — R64 Cachexia: Secondary | ICD-10-CM | POA: Diagnosis not present

## 2022-12-16 DIAGNOSIS — E538 Deficiency of other specified B group vitamins: Secondary | ICD-10-CM | POA: Diagnosis not present

## 2022-12-17 DIAGNOSIS — R627 Adult failure to thrive: Secondary | ICD-10-CM | POA: Diagnosis not present

## 2022-12-17 DIAGNOSIS — E876 Hypokalemia: Secondary | ICD-10-CM | POA: Diagnosis not present

## 2022-12-17 DIAGNOSIS — D696 Thrombocytopenia, unspecified: Secondary | ICD-10-CM | POA: Diagnosis not present

## 2022-12-17 DIAGNOSIS — R600 Localized edema: Secondary | ICD-10-CM | POA: Diagnosis not present

## 2022-12-17 DIAGNOSIS — R338 Other retention of urine: Secondary | ICD-10-CM | POA: Diagnosis not present

## 2022-12-17 DIAGNOSIS — J449 Chronic obstructive pulmonary disease, unspecified: Secondary | ICD-10-CM | POA: Diagnosis not present

## 2022-12-17 DIAGNOSIS — D649 Anemia, unspecified: Secondary | ICD-10-CM | POA: Diagnosis not present

## 2022-12-17 DIAGNOSIS — J9611 Chronic respiratory failure with hypoxia: Secondary | ICD-10-CM | POA: Diagnosis not present

## 2022-12-17 DIAGNOSIS — I480 Paroxysmal atrial fibrillation: Secondary | ICD-10-CM | POA: Diagnosis not present

## 2022-12-18 DIAGNOSIS — R627 Adult failure to thrive: Secondary | ICD-10-CM | POA: Diagnosis not present

## 2022-12-18 DIAGNOSIS — R338 Other retention of urine: Secondary | ICD-10-CM | POA: Diagnosis not present

## 2022-12-18 DIAGNOSIS — D696 Thrombocytopenia, unspecified: Secondary | ICD-10-CM | POA: Diagnosis not present

## 2022-12-18 DIAGNOSIS — R600 Localized edema: Secondary | ICD-10-CM | POA: Diagnosis not present

## 2022-12-18 DIAGNOSIS — I480 Paroxysmal atrial fibrillation: Secondary | ICD-10-CM | POA: Diagnosis not present

## 2022-12-19 DIAGNOSIS — R627 Adult failure to thrive: Secondary | ICD-10-CM | POA: Diagnosis not present

## 2022-12-20 DIAGNOSIS — R627 Adult failure to thrive: Secondary | ICD-10-CM | POA: Diagnosis not present

## 2022-12-21 DIAGNOSIS — R627 Adult failure to thrive: Secondary | ICD-10-CM | POA: Diagnosis not present

## 2022-12-22 DIAGNOSIS — J309 Allergic rhinitis, unspecified: Secondary | ICD-10-CM | POA: Diagnosis not present

## 2022-12-22 DIAGNOSIS — M6284 Sarcopenia: Secondary | ICD-10-CM | POA: Diagnosis not present

## 2022-12-22 DIAGNOSIS — D518 Other vitamin B12 deficiency anemias: Secondary | ICD-10-CM | POA: Diagnosis not present

## 2022-12-22 DIAGNOSIS — K219 Gastro-esophageal reflux disease without esophagitis: Secondary | ICD-10-CM | POA: Diagnosis not present

## 2022-12-22 DIAGNOSIS — E46 Unspecified protein-calorie malnutrition: Secondary | ICD-10-CM | POA: Diagnosis not present

## 2022-12-22 DIAGNOSIS — I4891 Unspecified atrial fibrillation: Secondary | ICD-10-CM | POA: Diagnosis not present

## 2022-12-22 DIAGNOSIS — R339 Retention of urine, unspecified: Secondary | ICD-10-CM | POA: Diagnosis not present

## 2022-12-22 DIAGNOSIS — J9690 Respiratory failure, unspecified, unspecified whether with hypoxia or hypercapnia: Secondary | ICD-10-CM | POA: Diagnosis not present

## 2022-12-22 DIAGNOSIS — I872 Venous insufficiency (chronic) (peripheral): Secondary | ICD-10-CM | POA: Diagnosis not present

## 2022-12-22 DIAGNOSIS — J9611 Chronic respiratory failure with hypoxia: Secondary | ICD-10-CM | POA: Diagnosis not present

## 2022-12-22 DIAGNOSIS — H04123 Dry eye syndrome of bilateral lacrimal glands: Secondary | ICD-10-CM | POA: Diagnosis not present

## 2022-12-22 DIAGNOSIS — Z85118 Personal history of other malignant neoplasm of bronchus and lung: Secondary | ICD-10-CM | POA: Diagnosis not present

## 2022-12-22 DIAGNOSIS — Z9981 Dependence on supplemental oxygen: Secondary | ICD-10-CM | POA: Diagnosis not present

## 2022-12-22 DIAGNOSIS — M6250 Muscle wasting and atrophy, not elsewhere classified, unspecified site: Secondary | ICD-10-CM | POA: Diagnosis not present

## 2022-12-22 DIAGNOSIS — R627 Adult failure to thrive: Secondary | ICD-10-CM | POA: Diagnosis not present

## 2022-12-22 DIAGNOSIS — I480 Paroxysmal atrial fibrillation: Secondary | ICD-10-CM | POA: Diagnosis not present

## 2022-12-22 DIAGNOSIS — E538 Deficiency of other specified B group vitamins: Secondary | ICD-10-CM | POA: Diagnosis not present

## 2022-12-22 DIAGNOSIS — Z743 Need for continuous supervision: Secondary | ICD-10-CM | POA: Diagnosis not present

## 2022-12-22 DIAGNOSIS — Z7901 Long term (current) use of anticoagulants: Secondary | ICD-10-CM | POA: Diagnosis not present

## 2022-12-22 DIAGNOSIS — I10 Essential (primary) hypertension: Secondary | ICD-10-CM | POA: Diagnosis not present

## 2022-12-22 DIAGNOSIS — D696 Thrombocytopenia, unspecified: Secondary | ICD-10-CM | POA: Diagnosis not present

## 2022-12-22 DIAGNOSIS — Z87891 Personal history of nicotine dependence: Secondary | ICD-10-CM | POA: Diagnosis not present

## 2022-12-22 DIAGNOSIS — D649 Anemia, unspecified: Secondary | ICD-10-CM | POA: Diagnosis not present

## 2022-12-22 DIAGNOSIS — E43 Unspecified severe protein-calorie malnutrition: Secondary | ICD-10-CM | POA: Diagnosis not present

## 2022-12-22 DIAGNOSIS — J439 Emphysema, unspecified: Secondary | ICD-10-CM | POA: Diagnosis not present

## 2022-12-22 DIAGNOSIS — E8770 Fluid overload, unspecified: Secondary | ICD-10-CM | POA: Diagnosis not present

## 2022-12-22 DIAGNOSIS — J449 Chronic obstructive pulmonary disease, unspecified: Secondary | ICD-10-CM | POA: Diagnosis not present

## 2022-12-23 DIAGNOSIS — D518 Other vitamin B12 deficiency anemias: Secondary | ICD-10-CM | POA: Diagnosis not present

## 2022-12-23 DIAGNOSIS — I480 Paroxysmal atrial fibrillation: Secondary | ICD-10-CM | POA: Diagnosis not present

## 2022-12-23 DIAGNOSIS — R627 Adult failure to thrive: Secondary | ICD-10-CM | POA: Diagnosis not present

## 2022-12-23 DIAGNOSIS — M6284 Sarcopenia: Secondary | ICD-10-CM | POA: Diagnosis not present

## 2022-12-23 DIAGNOSIS — E8770 Fluid overload, unspecified: Secondary | ICD-10-CM | POA: Diagnosis not present

## 2022-12-25 DIAGNOSIS — J439 Emphysema, unspecified: Secondary | ICD-10-CM | POA: Diagnosis not present

## 2022-12-25 DIAGNOSIS — D696 Thrombocytopenia, unspecified: Secondary | ICD-10-CM | POA: Diagnosis not present

## 2022-12-25 DIAGNOSIS — R627 Adult failure to thrive: Secondary | ICD-10-CM | POA: Diagnosis not present

## 2022-12-25 DIAGNOSIS — I10 Essential (primary) hypertension: Secondary | ICD-10-CM | POA: Diagnosis not present

## 2022-12-25 DIAGNOSIS — D518 Other vitamin B12 deficiency anemias: Secondary | ICD-10-CM | POA: Diagnosis not present

## 2022-12-25 DIAGNOSIS — E43 Unspecified severe protein-calorie malnutrition: Secondary | ICD-10-CM | POA: Diagnosis not present

## 2022-12-25 DIAGNOSIS — R339 Retention of urine, unspecified: Secondary | ICD-10-CM | POA: Diagnosis not present

## 2022-12-25 DIAGNOSIS — E8770 Fluid overload, unspecified: Secondary | ICD-10-CM | POA: Diagnosis not present

## 2022-12-25 DIAGNOSIS — M6284 Sarcopenia: Secondary | ICD-10-CM | POA: Diagnosis not present

## 2022-12-25 DIAGNOSIS — M6250 Muscle wasting and atrophy, not elsewhere classified, unspecified site: Secondary | ICD-10-CM | POA: Diagnosis not present

## 2022-12-25 DIAGNOSIS — I480 Paroxysmal atrial fibrillation: Secondary | ICD-10-CM | POA: Diagnosis not present

## 2022-12-27 DIAGNOSIS — E8770 Fluid overload, unspecified: Secondary | ICD-10-CM | POA: Diagnosis not present

## 2022-12-27 DIAGNOSIS — J439 Emphysema, unspecified: Secondary | ICD-10-CM | POA: Diagnosis not present

## 2022-12-27 DIAGNOSIS — R339 Retention of urine, unspecified: Secondary | ICD-10-CM | POA: Diagnosis not present

## 2022-12-27 DIAGNOSIS — I4891 Unspecified atrial fibrillation: Secondary | ICD-10-CM | POA: Diagnosis not present

## 2022-12-28 DIAGNOSIS — R339 Retention of urine, unspecified: Secondary | ICD-10-CM | POA: Diagnosis not present

## 2022-12-28 DIAGNOSIS — I4891 Unspecified atrial fibrillation: Secondary | ICD-10-CM | POA: Diagnosis not present

## 2022-12-28 DIAGNOSIS — E8770 Fluid overload, unspecified: Secondary | ICD-10-CM | POA: Diagnosis not present

## 2022-12-28 DIAGNOSIS — J439 Emphysema, unspecified: Secondary | ICD-10-CM | POA: Diagnosis not present

## 2023-01-12 DIAGNOSIS — D1771 Benign lipomatous neoplasm of kidney: Secondary | ICD-10-CM | POA: Diagnosis not present

## 2023-01-12 DIAGNOSIS — I10 Essential (primary) hypertension: Secondary | ICD-10-CM | POA: Diagnosis not present

## 2023-01-12 DIAGNOSIS — R339 Retention of urine, unspecified: Secondary | ICD-10-CM | POA: Diagnosis not present

## 2023-02-09 DIAGNOSIS — I10 Essential (primary) hypertension: Secondary | ICD-10-CM | POA: Diagnosis not present

## 2023-02-09 DIAGNOSIS — R339 Retention of urine, unspecified: Secondary | ICD-10-CM | POA: Diagnosis not present

## 2023-03-14 DIAGNOSIS — Z466 Encounter for fitting and adjustment of urinary device: Secondary | ICD-10-CM | POA: Diagnosis not present

## 2023-04-27 DIAGNOSIS — Z515 Encounter for palliative care: Secondary | ICD-10-CM | POA: Diagnosis not present

## 2023-04-27 DIAGNOSIS — I4891 Unspecified atrial fibrillation: Secondary | ICD-10-CM | POA: Diagnosis not present

## 2023-04-27 DIAGNOSIS — R5381 Other malaise: Secondary | ICD-10-CM | POA: Diagnosis not present

## 2023-04-27 DIAGNOSIS — R0602 Shortness of breath: Secondary | ICD-10-CM | POA: Diagnosis not present

## 2023-04-28 DIAGNOSIS — I4891 Unspecified atrial fibrillation: Secondary | ICD-10-CM | POA: Diagnosis not present

## 2023-04-29 DIAGNOSIS — I4891 Unspecified atrial fibrillation: Secondary | ICD-10-CM | POA: Diagnosis not present

## 2023-04-30 DIAGNOSIS — R195 Other fecal abnormalities: Secondary | ICD-10-CM | POA: Diagnosis not present

## 2023-04-30 DIAGNOSIS — Z7189 Other specified counseling: Secondary | ICD-10-CM | POA: Diagnosis not present

## 2023-04-30 DIAGNOSIS — R059 Cough, unspecified: Secondary | ICD-10-CM | POA: Diagnosis not present

## 2023-04-30 DIAGNOSIS — I4891 Unspecified atrial fibrillation: Secondary | ICD-10-CM | POA: Diagnosis not present

## 2023-04-30 DIAGNOSIS — R5381 Other malaise: Secondary | ICD-10-CM | POA: Diagnosis not present

## 2023-05-01 DIAGNOSIS — E43 Unspecified severe protein-calorie malnutrition: Secondary | ICD-10-CM | POA: Diagnosis not present

## 2023-05-01 DIAGNOSIS — I4891 Unspecified atrial fibrillation: Secondary | ICD-10-CM | POA: Diagnosis not present

## 2023-05-01 DIAGNOSIS — J449 Chronic obstructive pulmonary disease, unspecified: Secondary | ICD-10-CM | POA: Diagnosis not present

## 2023-06-01 DEATH — deceased
